# Patient Record
Sex: Male | Born: 1935 | Race: White | Hispanic: No | Smoking: Never smoker
Health system: Southern US, Community
[De-identification: ages and names within clinical notes are randomized; demographics above are authoritative.]

## PROBLEM LIST (undated history)

## (undated) DIAGNOSIS — K219 Gastro-esophageal reflux disease without esophagitis: Secondary | ICD-10-CM

## (undated) DIAGNOSIS — M1990 Unspecified osteoarthritis, unspecified site: Secondary | ICD-10-CM

## (undated) DIAGNOSIS — I495 Sick sinus syndrome: Secondary | ICD-10-CM

## (undated) DIAGNOSIS — I472 Ventricular tachycardia: Secondary | ICD-10-CM

## (undated) DIAGNOSIS — R519 Headache, unspecified: Secondary | ICD-10-CM

## (undated) DIAGNOSIS — I10 Essential (primary) hypertension: Secondary | ICD-10-CM

## (undated) DIAGNOSIS — I499 Cardiac arrhythmia, unspecified: Secondary | ICD-10-CM

## (undated) DIAGNOSIS — I4729 Other ventricular tachycardia: Secondary | ICD-10-CM

## (undated) DIAGNOSIS — E785 Hyperlipidemia, unspecified: Secondary | ICD-10-CM

## (undated) DIAGNOSIS — I2510 Atherosclerotic heart disease of native coronary artery without angina pectoris: Secondary | ICD-10-CM

## (undated) DIAGNOSIS — Z87442 Personal history of urinary calculi: Secondary | ICD-10-CM

## (undated) DIAGNOSIS — Z950 Presence of cardiac pacemaker: Secondary | ICD-10-CM

## (undated) DIAGNOSIS — F32A Depression, unspecified: Secondary | ICD-10-CM

## (undated) HISTORY — DX: Atherosclerotic heart disease of native coronary artery without angina pectoris: I25.10

## (undated) HISTORY — PX: OTHER SURGICAL HISTORY: SHX169

## (undated) HISTORY — PX: INSERT / REPLACE / REMOVE PACEMAKER: SUR710

## (undated) HISTORY — DX: Ventricular tachycardia: I47.2

## (undated) HISTORY — PX: PACEMAKER INSERTION: SHX728

## (undated) HISTORY — DX: Hyperlipidemia, unspecified: E78.5

## (undated) HISTORY — DX: Other ventricular tachycardia: I47.29

## (undated) HISTORY — DX: Sick sinus syndrome: I49.5

## (undated) HISTORY — DX: Presence of cardiac pacemaker: Z95.0

---

## 1999-01-05 HISTORY — PX: CORONARY ARTERY BYPASS GRAFT: SHX141

## 1999-02-05 DIAGNOSIS — I219 Acute myocardial infarction, unspecified: Secondary | ICD-10-CM

## 1999-02-05 HISTORY — DX: Acute myocardial infarction, unspecified: I21.9

## 1999-03-02 ENCOUNTER — Inpatient Hospital Stay (HOSPITAL_COMMUNITY): Admission: EM | Admit: 1999-03-02 | Discharge: 1999-03-14 | Payer: Self-pay | Admitting: Internal Medicine

## 1999-03-05 ENCOUNTER — Encounter: Payer: Self-pay | Admitting: Cardiothoracic Surgery

## 1999-03-06 ENCOUNTER — Encounter: Payer: Self-pay | Admitting: Cardiothoracic Surgery

## 1999-03-07 ENCOUNTER — Encounter: Payer: Self-pay | Admitting: Cardiothoracic Surgery

## 1999-03-08 ENCOUNTER — Encounter: Payer: Self-pay | Admitting: Cardiothoracic Surgery

## 1999-03-09 ENCOUNTER — Encounter: Payer: Self-pay | Admitting: Cardiothoracic Surgery

## 1999-03-11 ENCOUNTER — Encounter: Payer: Self-pay | Admitting: Cardiothoracic Surgery

## 2001-10-31 ENCOUNTER — Encounter: Admission: RE | Admit: 2001-10-31 | Discharge: 2001-10-31 | Payer: Self-pay | Admitting: Urology

## 2001-10-31 ENCOUNTER — Encounter: Payer: Self-pay | Admitting: Urology

## 2001-11-09 ENCOUNTER — Observation Stay (HOSPITAL_COMMUNITY): Admission: RE | Admit: 2001-11-09 | Discharge: 2001-11-10 | Payer: Self-pay | Admitting: Urology

## 2001-11-09 ENCOUNTER — Encounter (INDEPENDENT_AMBULATORY_CARE_PROVIDER_SITE_OTHER): Payer: Self-pay | Admitting: Specialist

## 2001-12-26 ENCOUNTER — Ambulatory Visit (HOSPITAL_BASED_OUTPATIENT_CLINIC_OR_DEPARTMENT_OTHER): Admission: RE | Admit: 2001-12-26 | Discharge: 2001-12-26 | Payer: Self-pay | Admitting: Urology

## 2002-10-29 ENCOUNTER — Encounter (INDEPENDENT_AMBULATORY_CARE_PROVIDER_SITE_OTHER): Payer: Self-pay

## 2002-10-29 ENCOUNTER — Ambulatory Visit (HOSPITAL_COMMUNITY): Admission: RE | Admit: 2002-10-29 | Discharge: 2002-10-29 | Payer: Self-pay | Admitting: Urology

## 2002-10-29 ENCOUNTER — Ambulatory Visit (HOSPITAL_BASED_OUTPATIENT_CLINIC_OR_DEPARTMENT_OTHER): Admission: RE | Admit: 2002-10-29 | Discharge: 2002-10-29 | Payer: Self-pay | Admitting: Urology

## 2002-10-29 ENCOUNTER — Encounter: Payer: Self-pay | Admitting: Urology

## 2003-01-05 HISTORY — PX: BLADDER STONE REMOVAL: SHX568

## 2003-12-25 ENCOUNTER — Ambulatory Visit: Payer: Self-pay | Admitting: *Deleted

## 2004-01-01 ENCOUNTER — Ambulatory Visit: Payer: Self-pay

## 2004-04-08 ENCOUNTER — Ambulatory Visit: Payer: Self-pay | Admitting: *Deleted

## 2004-10-20 ENCOUNTER — Ambulatory Visit: Payer: Self-pay | Admitting: *Deleted

## 2005-03-30 ENCOUNTER — Ambulatory Visit: Payer: Self-pay | Admitting: *Deleted

## 2005-04-19 ENCOUNTER — Ambulatory Visit: Payer: Self-pay | Admitting: *Deleted

## 2005-05-04 ENCOUNTER — Ambulatory Visit: Payer: Self-pay

## 2006-02-16 ENCOUNTER — Ambulatory Visit: Payer: Self-pay | Admitting: *Deleted

## 2006-02-16 LAB — CONVERTED CEMR LAB
AST: 27 units/L (ref 0–37)
Albumin: 3.6 g/dL (ref 3.5–5.2)
Alkaline Phosphatase: 69 units/L (ref 39–117)
BUN: 14 mg/dL (ref 6–23)
Chloride: 104 meq/L (ref 96–112)
Creatinine, Ser: 1.3 mg/dL (ref 0.4–1.5)
GFR calc non Af Amer: 57 mL/min
Total Bilirubin: 0.9 mg/dL (ref 0.3–1.2)
VLDL: 12 mg/dL (ref 0–40)

## 2006-02-22 ENCOUNTER — Ambulatory Visit: Payer: Self-pay

## 2006-02-22 ENCOUNTER — Encounter: Payer: Self-pay | Admitting: Cardiology

## 2006-08-10 ENCOUNTER — Ambulatory Visit: Payer: Self-pay | Admitting: Cardiology

## 2007-02-23 ENCOUNTER — Ambulatory Visit: Payer: Self-pay | Admitting: Cardiology

## 2008-09-04 HISTORY — PX: CARDIAC CATHETERIZATION: SHX172

## 2008-09-10 ENCOUNTER — Telehealth (INDEPENDENT_AMBULATORY_CARE_PROVIDER_SITE_OTHER): Payer: Self-pay | Admitting: *Deleted

## 2008-09-11 ENCOUNTER — Encounter: Payer: Self-pay | Admitting: Cardiovascular Disease

## 2008-09-11 ENCOUNTER — Ambulatory Visit: Payer: Self-pay | Admitting: Cardiovascular Disease

## 2008-09-11 ENCOUNTER — Inpatient Hospital Stay (HOSPITAL_COMMUNITY): Admission: AD | Admit: 2008-09-11 | Discharge: 2008-09-14 | Payer: Self-pay | Admitting: Cardiovascular Disease

## 2008-09-14 ENCOUNTER — Encounter: Payer: Self-pay | Admitting: Internal Medicine

## 2008-09-17 ENCOUNTER — Encounter: Payer: Self-pay | Admitting: Internal Medicine

## 2008-09-23 ENCOUNTER — Telehealth: Payer: Self-pay | Admitting: Cardiovascular Disease

## 2008-10-02 ENCOUNTER — Encounter: Payer: Self-pay | Admitting: Internal Medicine

## 2008-10-02 ENCOUNTER — Ambulatory Visit: Payer: Self-pay

## 2008-10-02 ENCOUNTER — Telehealth: Payer: Self-pay | Admitting: Cardiovascular Disease

## 2008-10-09 ENCOUNTER — Telehealth (INDEPENDENT_AMBULATORY_CARE_PROVIDER_SITE_OTHER): Payer: Self-pay | Admitting: *Deleted

## 2008-12-16 ENCOUNTER — Ambulatory Visit: Payer: Self-pay | Admitting: Internal Medicine

## 2009-02-26 ENCOUNTER — Telehealth (INDEPENDENT_AMBULATORY_CARE_PROVIDER_SITE_OTHER): Payer: Self-pay | Admitting: *Deleted

## 2009-03-17 ENCOUNTER — Ambulatory Visit: Payer: Self-pay | Admitting: Cardiovascular Disease

## 2009-04-21 ENCOUNTER — Telehealth: Payer: Self-pay | Admitting: Internal Medicine

## 2009-04-21 ENCOUNTER — Ambulatory Visit: Payer: Self-pay | Admitting: Cardiovascular Disease

## 2009-05-27 ENCOUNTER — Telehealth (INDEPENDENT_AMBULATORY_CARE_PROVIDER_SITE_OTHER): Payer: Self-pay | Admitting: *Deleted

## 2009-07-18 ENCOUNTER — Ambulatory Visit: Payer: Self-pay | Admitting: Internal Medicine

## 2009-08-18 ENCOUNTER — Telehealth (INDEPENDENT_AMBULATORY_CARE_PROVIDER_SITE_OTHER): Payer: Self-pay | Admitting: *Deleted

## 2009-08-20 ENCOUNTER — Ambulatory Visit: Payer: Self-pay | Admitting: Cardiovascular Disease

## 2009-08-21 ENCOUNTER — Telehealth (INDEPENDENT_AMBULATORY_CARE_PROVIDER_SITE_OTHER): Payer: Self-pay | Admitting: *Deleted

## 2009-08-25 ENCOUNTER — Encounter: Payer: Self-pay | Admitting: Internal Medicine

## 2009-08-25 ENCOUNTER — Ambulatory Visit: Payer: Self-pay | Admitting: Internal Medicine

## 2009-08-25 ENCOUNTER — Ambulatory Visit: Payer: Self-pay

## 2009-08-25 ENCOUNTER — Encounter (HOSPITAL_COMMUNITY): Admission: RE | Admit: 2009-08-25 | Discharge: 2009-09-23 | Payer: Self-pay | Admitting: Cardiovascular Disease

## 2009-08-28 ENCOUNTER — Ambulatory Visit: Payer: Self-pay | Admitting: Cardiovascular Disease

## 2009-10-16 ENCOUNTER — Ambulatory Visit: Payer: Self-pay | Admitting: Internal Medicine

## 2009-11-07 ENCOUNTER — Encounter (INDEPENDENT_AMBULATORY_CARE_PROVIDER_SITE_OTHER): Payer: Self-pay | Admitting: *Deleted

## 2009-11-18 ENCOUNTER — Ambulatory Visit: Payer: Self-pay | Admitting: Cardiovascular Disease

## 2009-12-02 ENCOUNTER — Telehealth (INDEPENDENT_AMBULATORY_CARE_PROVIDER_SITE_OTHER): Payer: Self-pay | Admitting: *Deleted

## 2010-01-15 ENCOUNTER — Encounter: Payer: Self-pay | Admitting: Internal Medicine

## 2010-01-15 ENCOUNTER — Ambulatory Visit
Admission: RE | Admit: 2010-01-15 | Discharge: 2010-01-15 | Payer: Self-pay | Source: Home / Self Care | Attending: Internal Medicine | Admitting: Internal Medicine

## 2010-02-03 NOTE — Assessment & Plan Note (Signed)
Summary: Cardiology Nuclear Testing  Nuclear Med Background Indications for Stress Test: Evaluation for Ischemia, Graft Patency  Indications Comments: Assess Cfx Area  History: CABG, Echo, GXT, Heart Catheterization, Myocardial Infarction, Myocardial Perfusion Study, Pacemaker  History Comments: 2001 AWMI, CABG x 5, 2007 GXT-Borderline ST Changes, 3 beat VT, 9/10 Heart Cath EF Nml 9/10 Pacemaker SSS- 60/120 9/10 Echo  EF Nml History of NSVT  Symptoms: Chest Pain, Chest Pain with Exertion, Dizziness, DOE, Fatigue with Exertion, Light-Headedness    Nuclear Pre-Procedure Caffeine/Decaff Intake: None NPO After: 9:30 PM Lungs: clear IV 0.9% NS with Angio Cath: 22g     IV Site: (R) Hand IV Started by: Irean Hong RN Chest Size (in) 38     Height (in): 68 Weight (lb): 150 BMI: 22.89 Tech Comments: Held metoprolol this am.  Nuclear Med Study 1 or 2 day study:  1 day     Stress Test Type:  Eugenie Birks Reading MD:  Arvilla Meres, MD     Referring MD:  Judie Petit.Arida Resting Radionuclide:  Technetium 53m Tetrofosmin     Resting Radionuclide Dose:  10.3 mCi  Stress Radionuclide:  Technetium 105m Tetrofosmin     Stress Radionuclide Dose:  33 mCi   Stress Protocol   Lexiscan: 0.4 mg   Stress Test Technologist:  Milana Na EMT-P     Nuclear Technologist:  Domenic Polite CNMT  Rest Procedure  Myocardial perfusion imaging was performed at rest 45 minutes following the intravenous administration of Myoview Technetium 65m Tetrofosmin.  Stress Procedure  The patient received IV Lexiscan 0.4 mg over 15-seconds.  Myoview injected at 30-seconds.  There were no significant changes and occ pacs with infusion.  Quantitative spect images were obtained after a 45 minute delay.  QPS Raw Data Images:  Normal; no motion artifact; normal heart/lung ratio. Stress Images:  There is normal uptake in all areas. Rest Images:  Normal homogeneous uptake in all areas of the myocardium. Subtraction  (SDS):  Normal Transient Ischemic Dilatation:  .89  (Normal <1.22)  Lung/Heart Ratio:  .23  (Normal <0.45)  Quantitative Gated Spect Images QGS EDV:  69 ml QGS ESV:  19 ml QGS EF:  72 % QGS cine images:  Normal  Findings Normal nuclear study      Overall Impression  Exercise Capacity: Lexiscan study with no exercise. ECG Impression: Baseline: NSR; No significant ST segment change with Lexiscan. Overall Impression: Normal stress nuclear study.

## 2010-02-03 NOTE — Progress Notes (Signed)
Summary: Nuc Pre-Procedure  Phone Note Outgoing Call Call back at [facility] Phone [phone]   Call placed by: Antionette Char RN,  August 21, 2009 3:00 PM Call placed to: Patient Summary of Call: Reviewed information on Myoview Information Sheet (see scanned document for further details).  Spoke with patient.     Nuclear Med Background Indications for Stress Test: Evaluation for Ischemia, Graft Patency  Indications Comments: Assess Cfx Area  History: CABG, Echo, GXT, Heart Catheterization, Myocardial Infarction, Myocardial Perfusion Study, Pacemaker  History Comments: 2001 AWMI, CABG x 5, 2007 GXT-Borderline ST Changes, 3 beat VT, 9/10 Heart Cath EF Nml 9/10 Pacemaker SSS- 60/120 9/10 Echo  EF Nml History of NSVT  Symptoms: Chest Pain, Chest Pain with Exertion, Dizziness, DOE, Fatigue with Exertion, Light-Headedness

## 2010-02-03 NOTE — Progress Notes (Signed)
Summary: chestpain   Phone Note Call from Patient Call back at Home Phone [phone]   Caller: Patient Reason for Call: Talk to Nurse Summary of Call: pt wants to be seen today. c/o chestpain. took nitro x2  on friday evening. Initial call taken by: Lorne Skeens,  April 21, 2009 8:55 AM  Follow-up for Phone Call        04/20/09--9am--pt calling stating he had chest pain on 04/17/09 --none since--pt advised to GO TO NEAREST ED--pt states he goes to our office in [place] and would rather go there--advised to call our [place] office and be seen by dr allen--pt Suzan Nailer office notified Follow-up by: Ledon Snare, RN,  April 21, 2009 9:03 AM

## 2010-02-03 NOTE — Progress Notes (Signed)
  Request for Records recieved from [facility] sent to [facility]  May 27, 2009 8:46 AM

## 2010-02-03 NOTE — Cardiovascular Report (Signed)
Summary: Office Visit Remote   Office Visit Remote   Imported By: Roderic Ovens 11/11/2009 15:29:22  _____________________________________________________________________  External Attachment:    Type:   Image     Comment:   External Document

## 2010-02-03 NOTE — Progress Notes (Signed)
Summary: PT DOES NOT FEEL RIGHT  Phone Note Call from Patient   Summary of Call: PT CALLED [place] OFFICE AND WAS TRANSFERRED TO [place] OFFICE.  COMPLAINED THAT HE FEELS LIKE HIS DEVICE HAS MOVED.  HAS LEFT ARM PAIN WITH MOVEMENT AND HAS HAD NAUSEA/DIZZINESS X 2 WEEKS.  "JUST DOESN'T FEEL RIGHT"  DR. KLEIN IN [place] 8-26 BUT NOT SURE IF HE SHOULD WAIT THAT LONG AND IF SO WHERE TO PUT ON SCHEDULE. Initial call taken by: Park Breed,  August 18, 2009 4:04 PM  Follow-up for Phone Call        Spoke with patient, states he is having random chest discomfort with nausea and dizziness and also feels as though his device has moved down in his chest.  He is being set up to see Dr. Kirke Corin in [place] for the evaluation of his chest pain and Dr. Graciela Husbands for device evaluation.   Follow-up by: Altha Harm, LPN,  August 20, 2009 2:24 PM

## 2010-02-03 NOTE — Letter (Signed)
Summary: Remote Device Check  [facility], Main Office  [address]   [place], [place] [address]   Phone: [phone]  Fax: [phone]     November 07, 2009 MRN: [id number]   [facility] [address] [place], [place]  [address]   Dear Mr. Scheer,   Your remote transmission was recieved and reviewed by your physician.  All diagnostics were within normal limits for you.  ___X__Your next transmission is scheduled for1/12/2010.:  Please transmit at any time this day.  If you have a wireless device your transmission will be sent automatically.  ______Your next office visit is scheduled for:                              . Please call our office to schedule an appointment.    Sincerely,  Altha Harm, LPN

## 2010-02-03 NOTE — Progress Notes (Signed)
  Phone Note Call from Patient   Summary of Call: Per patient phone call, since he [place] at the beach he has had left sided chest soreness, and generally not feeling well and wanted to know if it had to do with his device. According to the patient he was bending with his knees pressing into his chest around the area of his device but the soreness involves the entire left side of his chest.  I explained that that small amount of time and pressure was not harmful to the device but the chest discomfort along with not feeling well after washing the car and lasting into today may need to be evaluated.  He wanted to be seen in the [place] office the next time Dr. Graciela Husbands is there(03/10/09).  I also tried to explain if he is having discomfort and not feeling well he needs to be evaluated sooner and suggested an ER visit there. [name] was not agreeable to that and said he was driving back tomorrow and if he wasn't any better he'd call the [place] office.  I cautioned him on driving that distance not feeling well and his response was to let his wife drive.  I also cautioned that she couldn't watch the road and him at the same time.  We ended the conversation with the patient wanted to think before he proceeded with his decision on the options I outlined for him. Initial call taken by: Altha Harm, LPN,  February 26, 2009 1:30 PM

## 2010-02-03 NOTE — Procedures (Signed)
Summary: Cardiology Device Clinic   Allergies: No Known Drug Allergies  PPM Specifications Following MD:  Sherryl Manges, MD     PPM Vendor:  [employer]     PPM Model Number:  [id number]     PPM Serial Number:  [id number] PPM DOI:  09/13/2008     PPM Implanting MD:  Sherryl Manges, MD  Lead 1    Location: RA     DOI: 09/13/2008     Model #: [id number]     Serial #: [id number]     Status: active Lead 2    Location: RV     DOI: 09/13/2008     Model #: [id number]     Serial #: [id number]     Status: active  Magnet Response Rate:  BOL 85 ERI  65  Indications:  Sick sinus syndrome   PPM Follow Up Remote Check?  No Battery Voltage:  2.80 V     Battery Est. Longevity:  13 years     Pacer Dependent:  Yes       PPM Device Measurements Atrium  Impedance: 511 ohms, Threshold: 0.875 V at 0.4 msec Right Ventricle  Amplitude: 16 mV, Impedance: 1079 ohms, Threshold: 0.375 V at 0.4 msec  Episodes MS Episodes:  0     Percent Mode Switch:  0     Coumadin:  No Ventricular High Rate:  1     Atrial Pacing:  85.8%     Ventricular Pacing:  0.2%  Parameters Mode:  DDDR+     Lower Rate Limit:  60     Upper Rate Limit:  120 Paced AV Delay:  150     Sensed AV Delay:  120 Next Remote Date:  10/16/2009     Next Cardiology Appt Due:  07/05/2010 Tech Comments:  No parameter changes.  Device function normal. 1 VHR episode 4 seconds.  [facility] transmissions every 3 months. ROV 1 year with Dr. Graciela Husbands in [place]. Altha Harm, LPN  July 18, 2009 9:15 AM

## 2010-02-03 NOTE — Progress Notes (Signed)
Summary: pt has questions  Phone Note Call from Patient Call back at Home Phone [phone]   Caller: Patient/Nancy Reason for Call: Talk to Nurse, Talk to Doctor Summary of Call: pt wants to do accupuncture and has questions regarding how it will affect his pacer Initial call taken by: Omer Jack,  December 02, 2009 3:39 PM  Follow-up for Phone Call        Patient referred to tech support for [employer] Follow-up by: Altha Harm, LPN,  December 02, 2009 4:52 PM

## 2010-02-15 ENCOUNTER — Encounter (INDEPENDENT_AMBULATORY_CARE_PROVIDER_SITE_OTHER): Payer: Self-pay | Admitting: *Deleted

## 2010-02-16 ENCOUNTER — Telehealth: Payer: Self-pay | Admitting: Cardiovascular Disease

## 2010-02-25 NOTE — Cardiovascular Report (Signed)
Summary: Office Visit Remote   Office Visit Remote   Imported By: Roderic Ovens 02/17/2010 15:19:22  _____________________________________________________________________  External Attachment:    Type:   Image     Comment:   External Document

## 2010-02-25 NOTE — Progress Notes (Signed)
Summary: headaches  Phone Note Call from Patient   Caller: Spouse Summary of Call: Had chemical stress test in August-wondered if it could be the cause of Headaches. Has seen Dr. Adella Hare in [place] x 2 with no release from headaches.  Would like Dr. Kirke Corin to call wife.[phone] Initial call taken by: Dessie Coma  LPN,  February 16, 2010 12:00 PM    I called Mrs. Mathews Robinsons. It is not related. He will continue follow up with Neurology.

## 2010-02-25 NOTE — Letter (Signed)
Summary: Remote Device Check  [facility], Main Office  [address]   [place], [place] [address]   Phone: [phone]  Fax: [phone]     February 15, 2010 MRN: [id number]   [facility] [address] [place], [place]  [address]   Dear [name],   Your remote transmission was recieved and reviewed by your physician.  All diagnostics were within normal limits for you.  __X___Your next transmission is scheduled for:  04-09-2010.  Please transmit at any time this day.  If you have a wireless device your transmission will be sent automatically.   Sincerely,  Vella Kohler

## 2010-04-10 LAB — BASIC METABOLIC PANEL
CO2: 28 mEq/L (ref 19–32)
Calcium: 8.5 mg/dL (ref 8.4–10.5)
Creatinine, Ser: 1.05 mg/dL (ref 0.4–1.5)
GFR calc Af Amer: >60 mL/min (ref >60)
GFR calc non Af Amer: >60 mL/min (ref >60)
Glucose, Bld: 90 mg/dL (ref 70–99)
Sodium: 137 mEq/L (ref 135–145)

## 2010-04-10 LAB — CBC
Hemoglobin: 14 g/dL (ref 13.0–17.0)
MCHC: 33.3 g/dL (ref 30.0–36.0)
MCHC: 33.5 g/dL (ref 30.0–36.0)
MCV: 96.2 fL (ref 78.0–100.0)
MCV: 96.8 fL (ref 78.0–100.0)
RBC: 4.03 MIL/uL — ABNORMAL LOW (ref 4.22–5.81)
RBC: 4.34 MIL/uL (ref 4.22–5.81)
RDW: 14 % (ref 11.5–15.5)
RDW: 14.3 % (ref 11.5–15.5)

## 2010-04-10 LAB — DIFFERENTIAL
Basophils Absolute: 0 10*3/uL (ref 0.0–0.1)
Basophils Relative: 0 % (ref 0–1)
Eosinophils Absolute: 0.2 10*3/uL (ref 0.0–0.7)
Eosinophils Relative: 4 % (ref 0–5)
Monocytes Absolute: 0.5 10*3/uL (ref 0.1–1.0)
Monocytes Relative: 9 % (ref 3–12)
Neutro Abs: 2.8 10*3/uL (ref 1.7–7.7)

## 2010-04-10 LAB — COMPREHENSIVE METABOLIC PANEL
ALT: 12 U/L (ref 0–53)
AST: 24 U/L (ref 0–37)
Albumin: 3.8 g/dL (ref 3.5–5.2)
CO2: 31 mEq/L (ref 19–32)
Calcium: 9.2 mg/dL (ref 8.4–10.5)
Chloride: 103 mEq/L (ref 96–112)
GFR calc Af Amer: >60 mL/min (ref >60)
GFR calc non Af Amer: 57 mL/min — ABNORMAL LOW (ref >60)
Sodium: 141 mEq/L (ref 135–145)

## 2010-04-10 LAB — GLUCOSE, CAPILLARY
Glucose-Capillary: 89 mg/dL (ref 70–99)
Glucose-Capillary: 94 mg/dL (ref 70–99)

## 2010-04-10 LAB — CARDIAC PANEL(CRET KIN+CKTOT+MB+TROPI)
CK, MB: 1.3 ng/mL (ref 0.3–4.0)
CK, MB: 1.5 ng/mL (ref 0.3–4.0)
CK, MB: 2.1 ng/mL (ref 0.3–4.0)
Relative Index: INVALID (ref 0.0–2.5)
Total CK: 45 U/L (ref 7–232)
Troponin I: 0.02 ng/mL (ref 0.00–0.06)

## 2010-04-10 LAB — LIPID PANEL
HDL: 43 mg/dL (ref >39)
Total CHOL/HDL Ratio: 3.1 RATIO
Triglycerides: 75 mg/dL (ref <150)

## 2010-04-16 ENCOUNTER — Other Ambulatory Visit: Payer: Self-pay

## 2010-04-16 ENCOUNTER — Ambulatory Visit (INDEPENDENT_AMBULATORY_CARE_PROVIDER_SITE_OTHER): Payer: Medicare Other | Admitting: *Deleted

## 2010-04-16 DIAGNOSIS — I495 Sick sinus syndrome: Secondary | ICD-10-CM

## 2010-04-23 NOTE — Progress Notes (Signed)
Pacer remote 

## 2010-05-05 ENCOUNTER — Encounter: Payer: Self-pay | Admitting: *Deleted

## 2010-05-11 ENCOUNTER — Encounter: Payer: Self-pay | Admitting: Cardiovascular Disease

## 2010-05-14 ENCOUNTER — Ambulatory Visit (INDEPENDENT_AMBULATORY_CARE_PROVIDER_SITE_OTHER): Payer: Medicare Other | Admitting: Cardiovascular Disease

## 2010-05-14 ENCOUNTER — Encounter: Payer: Self-pay | Admitting: Cardiovascular Disease

## 2010-05-14 DIAGNOSIS — E785 Hyperlipidemia, unspecified: Secondary | ICD-10-CM | POA: Insufficient documentation

## 2010-05-14 DIAGNOSIS — I2510 Atherosclerotic heart disease of native coronary artery without angina pectoris: Secondary | ICD-10-CM | POA: Insufficient documentation

## 2010-05-14 NOTE — Assessment & Plan Note (Signed)
His most recent lipid profile was within acceptable range. Will continue treatment with simvastatin.

## 2010-05-14 NOTE — Progress Notes (Signed)
HPI  [name] is an 77 year old gentleman who is here today for a six-month followup visit. Overall he has been stable from a cardiac standpoint with no reported symptoms of chest pain, dyspnea, palpitations or dizziness. He continues to have almost daily headaches and is now being treated by Dr. Adella Hare in neurology. He was started on nortriptyline with some improvement in his symptoms. He has been taking all his cardiac medications with no issues.  No Known Allergies   Current Outpatient Prescriptions on File Prior to Visit  Medication Sig Dispense Refill  . aspirin 81 MG tablet Take 81 mg by mouth daily.        . metoprolol tartrate (LOPRESSOR) 25 MG tablet Take 25 mg by mouth 2 (two) times daily.       . Multiple Vitamin (MULTIVITAMIN) tablet Take 1 tablet by mouth daily.        . nitroGLYCERIN (NITROSTAT) 0.4 MG SL tablet Place 0.4 mg under the tongue every 5 (five) minutes as needed.        Marland Kitchen omeprazole (PRILOSEC) 20 MG capsule Take 40 mg by mouth daily.        . simvastatin (ZOCOR) 20 MG tablet Take 20 mg by mouth at bedtime.        Marland Kitchen DISCONTD: sertraline (ZOLOFT) 100 MG tablet Take 100 mg by mouth daily.        Marland Kitchen DISCONTD: traZODone (DESYREL) 50 MG tablet Take 50 mg by mouth at bedtime.           Past Medical History  Diagnosis Date  . Sinus node dysfunction     s/p PPM  . Ventricular tachycardia, non-sustained   . CAD (coronary artery disease)     s/p CABG in 2001  . HLD (hyperlipidemia)      Past Surgical History  Procedure Date  . Bladder stone removal 2005  . Pacemaker insertion   . Coronary artery bypass graft 2001    LIMA to LAD, SVG to RCA, SVG to ramus, sequential SVG to  first and second diagonal.  . Cardiac catheterization 09/2008    patent grafts. 80% proximal stneosis in left circumflex artery (subsequent stress test showed no ischemia)     Family History  Problem Relation Age of Onset  . Heart attack       History   Social History  . Marital  Status: Married    Spouse Name: N/A    Number of Children: 1  . Years of Education: N/A   Occupational History  . retired    Social History Main Topics  . Smoking status: Never Smoker   . Smokeless tobacco: Not on file  . Alcohol Use: No  . Drug Use: No  . Sexually Active: Not on file   Other Topics Concern  . Not on file   Social History Narrative  . No narrative on file      PHYSICAL EXAM   BP 129/83  Pulse 70  Ht 5\' 7"  (1.702 m)  Wt 155 lb (70.308 kg)  BMI 24.28 kg/m2  SpO2 93%  Constitutional: He is oriented to person, place, and time. He appears well-developed and well-nourished. No distress.  HENT: No nasal discharge.  Head: Normocephalic and atraumatic.  Eyes: Pupils are equal, round, and reactive to light. Right eye exhibits no discharge. Left eye exhibits no discharge.  Neck: Normal range of motion. Neck supple. No JVD present. No thyromegaly present.  Cardiovascular: Normal rate, regular rhythm, normal heart sounds and intact distal  pulses. Exam reveals no gallop and no friction rub.  No murmur heard.  Pulmonary/Chest: Effort normal and breath sounds normal. No stridor. No respiratory distress. He has no wheezes. He has no rales. He exhibits no tenderness.  Abdominal: Soft. Bowel sounds are normal. He exhibits no distension. There is no tenderness. There is no rebound and no guarding.  Musculoskeletal: Normal range of motion. He exhibits no edema and no tenderness.  Neurological: He is alert and oriented to person, place, and time. Coordination normal.  Skin: Skin is warm and dry. No rash noted. He is not diaphoretic. No erythema. No pallor.  Psychiatric: He has a normal mood and affect. His behavior is normal. Judgment and thought content normal.        ASSESSMENT AND PLAN

## 2010-05-14 NOTE — Patient Instructions (Signed)
Your physician recommends that you schedule a follow-up appointment in: 9 months  

## 2010-05-14 NOTE — Assessment & Plan Note (Signed)
The patient is doing well at this time from a cardiac standpoint with no symptoms suggestive of angina, heart failure or arrhythmia. Would continue with medical therapy. He would continue to take aspirin 81 mg daily and metoprolol 25 mg twice daily. Would continue management of his hyperlipidemia.

## 2010-05-19 NOTE — Assessment & Plan Note (Signed)
[facility]                        [place] CARDIOLOGY OFFICE NOTE   NAME:[name], [name]                        MRN:          [id number]  DATE:12/16/2008                            DOB:          08/29/1927    [name] seen in followup for pacemaker implanted for symptomatic  bradycardia.  He also has ischemic symptomatic bradycardia.  He is  feeling much better.  There has been no lightheadedness and less  shortness of breath.   He also has a history of ischemic heart disease and underwent  catheterization at [facility] that demonstrated a totally occluded circ  following the obtuse marginal and it was elected to defer PCI until  after the pacemaker was implanted and healed.   The patient was elected following discussions with somebody to not  pursue that as he was relatively asymptomatic.  I should note that he  also has a history of bypass surgery.  He currently denies problems with  exertional chest discomfort, shortness of breath, or chest pain.   Review of his echocardiogram demonstrated normal left ventricular  function.  EF was confirmed as normal by ventriculography.   His current medications include:  1. Simvastatin 20.  2. Omeprazole 20.  3. Sertraline.  4. Amlodipine 2.5.  5. Metoprolol 25 b.i.d.  6. Trazodone.  7. Aspirin.   On examination today, his blood pressure was 127/77, his pulse was 70,  his weight was 152, which is up 5 pounds.  His neck veins were flat.  His lungs were clear.  Back was without kyphosis and there was no CVA  tenderness.  Heart sounds were regular with a high-pitched 2/6 systolic  murmur heard at the left lower sternal border.  The abdomen was soft.  Extremities were without edema.  His affect was relatively bright (see  below).   Interrogation of his pacemaker demonstrated that he is 88% atrial paced,  pacing parameters were all otherwise normal with an atrial impedance of  527, ventricle impedance of 1261.   There was no intrinsic atrial rhythm.  The R-wave was 16.0.   IMPRESSION:  1. Sinus node dysfunction.  2. Status post pacer for the above.  3. Ischemic heart disease.      a.     Status post coronary artery bypass graft.      b.     Normal left ventricular function.      c.     Occluded circumflex with PCI leak, initially decide upon and       then deferred.  4. Dyslipidemia.   [name] is doing well.  We will plan to see him again in 9 months.  I  should note that I reviewed with the family his liver tests and his  lipids, which demonstrated an LDL of 77 and normal transaminases.  He  has not been following up with a primary care physician.  We will plan  to him come back and see [name] in 3 months to reestablish cardiac  followup.  He will need his lipids and liver function test reassessed at  that time.  Duke Salvia, MD, [facility]  Electronically Signed    SCK/MedQ  DD: 12/16/2008  DT: 12/17/2008  Job #: [id number]

## 2010-05-19 NOTE — Assessment & Plan Note (Signed)
[place] [facility]                        [place] CARDIOLOGY OFFICE NOTE   NAME:[name], [name]                        MRN:          [id number]  DATE:09/11/2008                            DOB:          27-Feb-1927    CHIEF COMPLAINT:  Dizziness and chest pain.   HISTORY OF PRESENT ILLNESS:  [name] is a 77 year old white male with  past medical history significant for coronary artery disease status post  coronary artery bypass surgery in 2001 by Dr. Tyrone Sage, hyperlipidemia,  history of asymptomatic sinus bradycardia in the high 40s and low 50s  who is presenting today with a several week to month history of  worsening dizziness, fatigue, and chest discomfort.  The patient states  that for the past several weeks he has been consistently dizzy.  He  denies any frank syncopal episodes.  The patient also states that his  energy level has been severely decreased.  The patient also endorses a  slight increase in chest discomfort with exertion that lasts for hours  and sometimes a whole day, associated with increased fatigue, before  remitting after a prolonged rest.  Yesterday while restoring an old  automobile, patient had acute onset of substernal chest discomfort  without radiation associated with fatigue that has persisted through  today (pt quantifies it as a 2/10).  The patient states that this is not  uncommon to persist this long.  However, the intensity is somewhat worse  than usual.  Of note, the patient has also had significant psychosocial  stressors in the past year as his daughter has been diagnosed with  esophageal cancer which unfortunately has been deemed terminal.   PAST MEDICAL HISTORY:  CAD s/p CABG in 2001 with the LIMA to the LAD,  sequential SVG to D1 and D2, SVG to the OM1, SVG to the distal RCA  Hyperlipidemia  h/o asymptomatic sinus bradycardia (40's and 50's)   SOCIAL HISTORY:  No tobacco, no alcohol.   FAMILY HISTORY:  Significant  for premature coronary disease.   ALLERGIES:  No known drug allergies.   MEDICATIONS:  1. Aspirin 81 mg nightly.  2. Zoloft 50 mg daily.  3. Multivitamin daily.  4. Omeprazole 20 mg daily.   REVIEW OF SYSTEMS:  The patient denies any syncope.  Endorses trace  lower extremity edema.  Denies any PND or orthopnea.  The patient also  denies any hematochezia, melena or other bleeding issues.  Other systems  as in HPI; otherwise, negative.   PHYSICAL EXAMINATION:  Patient has a blood pressure of 146/69, his heart  rate is 40, rechecked by me is 38.  He weighs 147 pounds, and he is  saturating 96% on room air.  GENERAL:  He is in no acute distress.  HEENT:  Normocephalic, atraumatic.  Cranial nerves II-XII are grossly  intact.  NECK:  Supple.  There is no JVD.  There are no carotid bruits.  Carotid  upstrokes normal.  HEART:  Regular but bradycardic without murmur, rub or gallop.  LUNGS:  Have some bibasilar crackles.  ABDOMEN:  Nontender, nondistended, soft.  EXTREMITIES:  Without  edema.  SKIN:  Warm and dry.  NEUROLOGICAL:  Nonfocal.  PSYCHIATRIC:  Patient is appropriate with normal levels of insight.  MUSCULOSKELETAL:  Patient is 5/5 bilateral upper and lower extremity  strength.  Pulses:  Patient is 2+ bilateral carotid and radial pulses  with bilateral palpable posterior tibial pulses.  EKG from today in the office independently reviewed by myself  demonstrates normal sinus bradycardia with a rate of 37 beats per  minute.   ASSESSMENT:  77yo white male with known CAD, now approximately 9 years  after his coronary artery bypass grafting who has developed progressive  and now symptomatic sinus bradycardia.  The patient is also having chest  discomfort with exertion which has slightly worsened over the past year.  This chest discomfort may be secondary to his sinus bradycardia or it  may be a result of progressive atherosclerosis of his coronary artery  bypass graft(s).    PLAN:  After discussion with the patient, we will admit him directly to  [facility].  He was given ASA 162mg  in clinic.  Upon admission  he will be ruled out for acute myocardial infarction, and we will check  a transthoracic echocardiogram to evaluate the left ventricular systolic  function.  He should likely undergo ischemia testing in the form of a  left heart catheterization (assuming a normal renal function) or  possibly a stress test.  He subsequently should undergo permanent  pacemaker implantation for symptomatic sinus bradycardia.  We will place  him on ASA, statin, NTG.  If hemoglobin is within normal limits,  anticoagulation with unfractionated heparin or lovenox will be  considered.  This case was discussed with Dr. Graciela Husbands and Dr. Eden Emms who  will be accepting the patient.     Brayton El, MD  Electronically Signed    SGA/MedQ  DD: 09/11/2008  DT: 09/11/2008  Job #: [id number]

## 2010-05-19 NOTE — Assessment & Plan Note (Signed)
[facility]                        [place] CARDIOLOGY OFFICE NOTE   NAME:[name], [name]                        MRN:          [id number]  DATE:11/18/2009                            DOB:          March 11, 1927    [name] is an 77 year old gentleman who is here today for a followup  visit.  He has the following problem list:  1. Coronary artery disease status post coronary artery bypass graft      surgery in 2001.  Most recent cardiac catheterization was done in      September 2010 which showed patent LIMA to LAD, patent SVG to      distal RCA, patent SVG to ramus, and patent sequential SVG to first      and second diagonals.  Circumflex distribution was not bypassed.      Native circumflex had an 80% proximal stenosis.  Ejection fraction      was normal.  Subsequent stress test showed no evidence of ischemia.  2. Sinus node dysfunction status post permanent pacemaker placement.  3. Nonsustained ventricular tachycardia.  4. Hyperlipidemia.   INTERVAL HISTORY:  [name] is here today for a followup visit.  Overall, he has been doing reasonably well.  During his last visit, I  added Imdur 30 mg once daily.  However, he developed headache and  stopped taking the medication.  Since then he continued to have headache  and has been evaluated by CT scan and some labs.  His headache is still  happening regularly.  He has not had any recent chest pain.  His dyspnea  is stable.  His home blood pressure and heart rate appeared to be in the  optimal range.   MEDICATIONS:  1. Multivitamin once daily.  2. Omeprazole 20 mg two tablets once daily.  3. Simvastatin 20 mg once daily.  4. Trazodone 50 mg once daily.  5. Aspirin 81 mg once daily.  6. Sertraline 100 mg once daily.  7. Metoprolol tartrate 25 mg half a tablet twice daily.   PHYSICAL EXAMINATION:  VITAL SIGNS:  Weight is 151.6, blood pressure is  124/73, pulse is 63, oxygen saturation is 96% on room  air.  NECK:  Reveals no JVD or carotid bruits.  LUNGS:  Clear to auscultation.  HEART:  Regular rate and rhythm with premature beats.  There are no  gallops or murmurs.  ABDOMEN:  Benign, nontender, nondistended.  EXTREMITIES:  With no clubbing, cyanosis, or edema.   IMPRESSION:  1. Coronary artery disease status post coronary artery bypass graft      surgery:  The patient is not having any symptoms suggestive of      angina at this time.  He has mild palpitations.  His dyspnea is      stable.  We will continue with medical therapy for now.  2. Hypertension:  His blood pressure is well-controlled with      metoprolol alone which will be continued.  3. Hyperlipidemia:  His most recent lipid profile in March of this      year was optimal.  We will continue  with simvastatin.  He will      follow up.  Regarding his headache, I advised him to continue      following up with his primary care physician.  A Neurology      consultation might be appropriate if he continues to have this      problem.  He will follow up in 6 months from now or earlier if      needed.     Lorine Bears, MD  Electronically Signed    MA/MedQ  DD: 11/18/2009  DT: 11/18/2009  Job #: [id number]

## 2010-05-19 NOTE — Assessment & Plan Note (Signed)
[facility]                        [place] CARDIOLOGY OFFICE NOTE   NAME:[name], [name]                        MRN:          [id number]  DATE:08/20/2009                            DOB:          22-Sep-1927    PROBLEM LIST:  1. Coronary artery disease status post coronary artery bypass graft      surgery in 2001.  Most recent cardiac catheterization was done in      September 2010, which showed severe native 3-vessel coronary artery      disease, patent LIMA to LAD, patent SVG to distal RCA, patent SVG      to ramus and patent sequential SVG to first and second diagonals.      The circumflex distribution was not bypassed.  The native      circumflex had an 80% proximal stenosis that was not treated at      that time.  Ejection fraction was normal.  2. Sinus node dysfunction status post permanent pacemaker placement.  3. Hyperlipidemia.   INTERIM HISTORY:  The patient is here today for a followup visit.  Overall, he has been doing reasonably well.  However, he has been having  increased symptoms of chest pain, which he describes as aching in the  chest which can happen any time with rest and with activities.  He is  overall a poor historian about it and cannot characterize it very well.  He also mentions that he is getting dyspnea with minimal activities,  which has been progressive.  He has been taking all his medications.  He  is complaining of dizziness and generalized fatigue and thinks it might  be due to his relatively low blood pressure.   MEDICATIONS:  1. Multivitamin once daily.  2. Omeprazole 20 mg once daily.  3. Simvastatin 20 mg daily.  4. Sertraline 50 mg once daily.  5. Amlodipine 2.5 mg once daily.  6. Metoprolol tartrate 25 mg twice daily.  7. Trazodone 50 mg once daily.  8. Aspirin 81 mg once daily.   ALLERGIES:  No known drug allergies.   PHYSICAL EXAMINATION:  GENERAL:  The patient is pleasant and in no acute  distress.  VITAL SIGNS:  Weight is 152.6 pounds, blood pressure is 107/65, pulse is  60 and regular, oxygen saturation is 93% on room air.  HEENT:  Normocephalic, atraumatic.  NECK:  No JVD or carotid bruits.  RESPIRATORY:  Normal respiratory effort with no use of accessory  muscles.  Auscultation reveals few crackles bilaterally.  CARDIOVASCULAR:  Normal PMI.  Regular rate and rhythm with no gallops or  murmurs.  ABDOMEN:  Benign, nontender, nondistended.  EXTREMITIES:  No clubbing, cyanosis, or edema.  PSYCHIATRIC:  He is alert, oriented x3 with normal mood and affect.  SKIN:  No rash.  MUSCULOSKELETAL:  Normal muscle strength in upper and lower extremity.   IMPRESSION:  1. Coronary artery disease status post coronary artery bypass surgery.      Currently, he is complaining of atypical chest pain and increased      exertional dyspnea.  His most recent cardiac catheterization in  September 2010 showed significant stenosis in proximal circumflex,      which was not bypassed.  I think before proceeding with a cardiac      catheterization, it is important to document if he has ischemia in      that area, which might be responsible for his symptoms.  Thus, I      recommend proceeding with a Lexiscan nuclear stress test.      According to the patient, he is not able to exercise on the      treadmill due to poor exercise capacity.  2. Dizziness which might be due to his relatively low blood pressure.      According to him, he never had hypertension before.  We will      discontinue amlodipine for now and ask him to bring his blood      pressure readings with him during the next visit.  3. History of nonsustained ventricular tachycardia.  We will continue      with the metoprolol.  4. Hyperlipidemia:  His lipid profile was reviewed and it is at      target.  The patient will follow up after stress test.     Lorine Bears, MD  Electronically Signed    MA/MedQ  DD: 08/20/2009  DT:  08/21/2009  Job #: [id number]

## 2010-05-19 NOTE — Assessment & Plan Note (Signed)
[facility]                        [place] CARDIOLOGY OFFICE NOTE   NAME:[name], [name] ODDO                        MRN:          [id number]  DATE:04/21/2009                            DOB:          04-25-1927    PROBLEM LIST:  1. Coronary artery disease status post coronary artery bypass graft in      2001, with a left internal mammary artery graft to left anterior      descending coronary artery, saphenous vein graft to D1 and D2,      saphenous vein graft to obtuse marginal 1, and saphenous vein graft      to distal right coronary artery, last left heart catheterization in      September 2010, showing patent grafts and a severe stenosis in the      apical portion of the left anterior descending coronary artery.  2. Hyperlipidemia.  3. History of symptomatic sinus bradycardia, status post pacemaker in      September 2010.   INTERVAL HISTORY:  The patient comes into clinic today, complaining of 3-  day history of abdominal discomfort.  The patient states that after  playing golf on Friday evening, he had onset of epigastric discomfort.  This discomfort has been present without remission for the past 3 days  and it is sometimes worsened by twisting his thorax or by pressing on  his abdomen.  He denies any hematochezia, melena, nausea, or vomiting.  He also denies any dyspnea.  He has been taking Advil several times a  day for the last few days for headache.  The patient states he has also  taken nitroglycerin which seemed to may be partially help the  discomfort, but not consistently so.   PHYSICAL EXAMINATION:  VITAL SIGNS:  His blood pressure is 104/63, his  pulse is 60.  He weighs 155 pounds which is stable from March and he is  saturating 95% on room air.  GENERAL:  No acute distress.  HEENT:  Normocephalic, atraumatic.  HEART:  Regular rate and rhythm without murmur, rub, or gallop.  LUNGS:  Clear bilaterally.  ABDOMEN:  Epigastric tenderness to  deep palpation.  No rebound.  No  guarding.  EXTREMITIES:  Without edema.  SKIN:  Warm and dry.  PSYCHIATRIC:  The patient is appropriate.   Review of EKG taken today in clinic independently interpreted by myself  demonstrates an A-paced rhythm at 65 beats per minute.  No significant  ST-segment or T-wave abnormalities.   ASSESSMENT:  A 77 year old male with coronary artery disease, presenting  with epigastric discomfort that is likely gastrointestinal in origin.  This could represent cholecystitis, pancreatitis, peptic ulcer disease.  Also, the differential is abdominal aortic aneurysm.  Angina is much  less likely as the patient has had this pain for several days without  remission and there is clear tenderness to palpation on exam.   PLAN:  We will order cardiac enzymes today to assure normal troponin.  We will also check a CMP, CBC, amylase, and lipase.  I will order an  abdominal ultrasound to rule out AAA and to  rule out cholecystitis.  The  patient is encouraged to make an appointment with his primary care  physician.  He should not take any antiinflammatory medications such as  Advil.  He should increase his omeprazole to twice a day.  He should  also decrease his aspirin to 81 mg daily for now.  Should the patient's  symptoms worsen or develop any associated symptoms such as shortness of  breath or diaphoresis, he should report to the emergency room.     Brayton El, MD  Electronically Signed    SGA/MedQ  DD: 04/21/2009  DT: 04/22/2009  Job #: [id number]

## 2010-05-19 NOTE — Assessment & Plan Note (Signed)
[facility]                        [place] CARDIOLOGY OFFICE NOTE   NAME:[name], [name]                        MRN:          [id number]  DATE:03/17/2009                            DOB:          1927/11/20    PROBLEM LIST:  1. Coronary artery disease status post coronary artery bypass graft in      2001 with a left internal mammary artery to left anterior      descending, sequential vein graft to diagonal 1 and diagonal 2,      vein graft to obtuse marginal 1 and vein graft to the distal right      coronary artery.  Last left heart catheterization in September 2010      showing patent grafts, severe stenosis in the apical portion of the      left anterior descending.  2. Hyperlipidemia.  3. History of symptomatic sinus bradycardia status post pacemaker      placement in September 2010.   INTERVAL HISTORY:  The patient states since his pacemaker placement, he  has felt significantly improved.  Specifically, he denies any chest  discomfort, dyspnea, dizziness or syncope.  He has been compliant with  his medications and is without complaint.  The patient does state that  he was in a squatted position restoring a car and realized that he knee  was pressing against his pacemaker generator.  He had discomfort around  the generator for 2-3 days that subsequently resolved.   PHYSICAL EXAMINATION:  VITAL SIGNS:  His blood pressure is 104/61, pulse  is 66, sating 95% on room air.  He weighs 155 pounds, which is 2-1/2  pounds more than he weighed in December.  GENERAL:  He is in no acute distress.  HEENT:  Normocephalic, atraumatic.  NECK:  Supple.  There is no JVD.  No carotid bruit.  HEART:  Regular rate and rhythm without murmur, rub, or gallop.  LUNGS:  Clear bilaterally.  ABDOMEN:  Soft, nontender, and nondistended.  EXTREMITIES:  Without edema.  SKIN:  Warm and dry.  Pulses are 2+ bilateral radial and posterior  tibial pulses.   Reviewed the  patient's heart catheterization as above in problem list.  The patient's last lipid profile in September 2010 showed an LDL of 75,  HDL of 43, total cholesterol of 133 and triglyceride level of 75.   ASSESSMENT AND PLAN:  1. Coronary artery disease.  The patient is not having any symptoms of      angina.  At this point, I see no need for repeat catheterization      and intervention on the distal left anterior descending lesion as      he is asymptomatic from an ischemia standpoint.  The patient can      decrease his aspirin dose to 162 mg daily.  He should also continue      on his beta-blocker and statin.  Ejection fraction was normal at      the time of heart catheterization.  2. Hyperlipidemia.  His LDL is well controlled but was not at goal of  70, last September.  We will check his fasting lipid profile and      consider increasing his simvastatin to 40 mg based on that.  3. The patient is physically inactive.  We recommend he begin walking      with his wife most days of the week for a 15-30 minutes.  He should      contact our office if any chest pain or shortness of breath      develops.  We will see the patient back in 4 months' time.     Brayton El, MD  Electronically Signed    SGA/MedQ  DD: 03/17/2009  DT: 03/18/2009  Job #: [id number]

## 2010-05-19 NOTE — Assessment & Plan Note (Signed)
[facility]                            CARDIOLOGY OFFICE NOTE   NAME:[name], [name]                        MRN:          [id number]  DATE:08/10/2006                            DOB:          04/08/1927    PRIMARY CARE PHYSICIAN:  Dr. Lianne Bushy.   REASON FOR VISIT:  Routine followup.   HISTORY OF PRESENT ILLNESS:  [name] is a pleasant gentleman, former  patient of Dr. Glennon Hamilton, with history of coronary artery bypass  grafting back in 2001 by  Dr. Tyrone Sage. He has done well overall and is  denying any problems with significant angina or dyspnea on exertion. He  has had ventricular ectopy noted in the past by Dr. Corinda Gubler although in  the setting of preserved left ventricular function systolic function and  no frank syncope. Echocardiogram in February of this year demonstrated  an ejection fraction of 55% to 60% with mild aortic regurgitation, mild-  to-moderate mitral regurgitation, and mild left atrial enlargement. He  had a reassuring Myoview in May of last year as well. Today's  electrocardiogram demonstrates sinus bradycardia at 41 beats per minute.  [name] says that he does not have any major palpitations or syncope,  but does sometimes get a little dizzy when he strains. He had some  recent blood work revealing good LDL control at 68 and normal liver  function test.   ALLERGIES:  No known drug allergies.   PRESENT MEDICATIONS:  1. Enteric coated aspirin 81 mg p.o. daily.  2. Multivitamin 1 p.o. daily.  3. Zocor 20 mg p.o. daily.  4. Desyrel 150 mg p.o. nightly.  5. Prilosec 20 mg p.o. daily.  6. Zoloft 25 mg p.o. daily.  7. Sublingual nitroglycerine p.r.n.   REVIEW OF SYSTEMS:  As described in history of present illness.   PHYSICAL EXAMINATION:  Blood pressure is 147/80, heart rate 44, weight  146 pounds. Patient is comfortable in no acute distress.  HEENT: Normal.  NECK: Supple. No elevated jugular venous pressure. No loud  carotid  bruits.  LUNGS: Clear. Nonlabored breathing at rest.  CARDIAC EXAM: Reveals a regular rate and rhythm, soft systolic murmur at  the apex. No S3 gallop.  ABDOMEN: Soft, no loud bruits. No tenderness.  EXTREMITIES: Exhibit no pitting edema and distal pulses are 2+.   IMPRESSION/RECOMMENDATIONS:  1. Coronary artery disease, status post prior coronary artery bypass      surgery in 2001 with preserved ejection fraction and reassuring      Myoview from May of last year. [name] is symptomatically stable      and we will plan to continue medical therapy with symptom      observation in 6 months.  2. Resting sinus bradycardia. He is not having any frank syncope. He      does get dizzy at times when he strains. I have asked him to keep      an eye on this.  3. Hypertension noted today. Blood pressure has typically been better      controlled. If this becomes more of a regular  reading for him, then      I would anticipate he may need perhaps an ACE inhibitor.  4. Excellent LDL control on Zocor.     [name], MD  Electronically Signed    SGM/MedQ  DD: 08/10/2006  DT: 08/10/2006  Job #: [id number]   cc:   Lianne Bushy, M.D.

## 2010-05-19 NOTE — Assessment & Plan Note (Signed)
[facility]                        [place] CARDIOLOGY OFFICE NOTE   NAME:[name], [name]                        MRN:          [id number]  DATE:08/28/2009                            DOB:          12-22-1927    This is a followup visit.   PROBLEM LIST:  1. Coronary artery disease status post coronary artery bypass graft      surgery in 2001.  Most recent cardiac catheterization was done in      September of 2010, which showed severe native three-vessel coronary      artery disease, patent left internal mammary artery to left      anterior descending coronary artery, patent saphenous vein graft to      distal right coronary artery, patent saphenous vein graft to ramus      and patent sequential saphenous vein graft to first and second      diagonals.  Circumflex distribution was not bypassed.  The native      circumflex had an 80% proximal stenosis that was treated medically      at that time.  Ejection fraction was normal.  2. Sinus node dysfunction status post permanent pacemaker placement.  3. Nonsustained ventricular tachycardia.  4. Hyperlipidemia.   INTERVAL HISTORY:  The patient still feels the same.  He still has  multiple complaints including chest pain, shoulder pain, and back pain.  These are chronic complaints.  He has exertional dyspnea as well.  He is  not exercising regularly.  As I mentioned before, usually cannot give  further details about the discomfort and seems to be a poor historian  about this.  Most of that time, it happens at rest, but it can happen  with activities.  During his last visit, his blood pressure was  relatively low and we stopped amlodipine.  He brought his blood pressure  readings with him and they seem to be within the optimal range.  He had  few readings where the systolic blood pressure was less than 100.   Medications include:  1. Multivitamin once daily.  2. Omeprazole 20 mg daily.  3. Simvastatin 20 mg  daily.  4. Sertraline 50 mg once daily.  5. Metoprolol tartrate 25 mg twice daily.  6. Trazodone 50 mg once daily.  7. Aspirin 81 mg once daily.  8. Imdur 30 mg once daily will be added today.   ALLERGIES:  No known drug allergies.   PHYSICAL EXAMINATION:  VITAL SIGNS:  Weight is 153.2 pounds, blood  pressure is 122/80, pulse is 63, oxygen saturation is 94% on room air.  NECK:  No JVD or carotid bruits.  LUNGS:  Clear to auscultation.  HEART:  Regular rate and rhythm with no gallops or murmurs.  ABDOMEN:  Benign, nontender, nondistended.  EXTREMITIES:  With no clubbing, cyanosis, or edema.   IMPRESSION:  1. Coronary artery disease status post coronary artery bypass graft      surgery:  The patient still with atypical chest pain.  We did a      Lexiscan nuclear stress test which showed no evidence of ischemia  with normal ejection fraction.  His symptoms as I mentioned are      atypical.  I will go ahead and start him on Imdur 30 mg once daily.      I also asked him to start an exercise program 4 times a week.  His      lack of physical activities might be contributing to his multiple      complaints.  We will continue with metoprolol 25 mg twice daily as      well as aspirin 81 mg once daily.  He is already on a statin with      simvastatin.  2. Hypertension:  His low blood pressure has improved after stopping      amlodipine.  We will continue only with the metoprolol.  3. History of nonsustained ventricular tachycardia:  Continue with      metoprolol.  4. Hyperlipidemia:  His lipid profile in March of 2011 was at target      with an LDL of 57.  We will likely repeat in 6 months from now.  He      will follow up with me in 3 months from now or earlier if needed.     Lorine Bears, MD  Electronically Signed    MA/MedQ  DD: 08/28/2009  DT: 08/28/2009  Job #: [id number]

## 2010-05-19 NOTE — Assessment & Plan Note (Signed)
[name]                        [name] OFFICE NOTE   NAME:[name], [name]                        MRN:          [id number]  DATE:07/18/2009                            DOB:          March 30, 1927    [name] is seen in followup for coronary disease, sinus node  dysfunction with previously implanted pacemaker.  He has been doing  quite well.  He had some problem with his belly a couple months ago.  These issues resolve.  At that time, he underwent an echo which  demonstrated normal left ventricular function.   He has a little bit of exercise intolerance but mostly at the end of a  day.  He denies significant sluggishness.   His medications include metoprolol 25 b.i.d., trazodone, aspirin,  amlodipine 2.5, sertraline, simvastatin.   PHYSICAL EXAMINATION:  VITAL SIGNS:  Today, his blood pressure was  125/80, his weight was 154 which is stable, his pulse was 61.  NECK:  His neck veins were flat.  LUNGS:  Clear.  HEART:  Heart sounds were regular.  ABDOMEN:  Soft.  EXTREMITIES:  Without edema.  NEUROLOGIC:  Grossly normal apart from hearing aids.   Interrogation of his [employer] adaptive pulse generator demonstrated  that he is atrially paced about 86% at a time.  He has reasonable heart  rate excursion.  He did have 1 episode of nonsustained ventricular  tachycardia.  There was no mode switch episodes.  His atrial impedance  was 511.  Ventricular impedance was 1079.  R-wave was 16.  There was no  significant intrinsic P-wave and thresholds were normal, although I do  not have recorded.   IMPRESSION:  1. Sinus node dysfunction.  2. Status post pacer for the above.  3. Ventricular tachycardia identified by electrograms.  4. Ischemic heart disease with,      a.     Prior bypass.      b.     Normal left ventricular function.  5. Fatigue.   [name] is doing very well.  We will back down on his metoprolol to  12.5 twice a day to see  if that does not have some impact on his  fatigue.   His nonsustained ventricular tachycardia is not an issue at this point  given his normal left ventricular function.  We will plan to see him  again in 12 months' time and he will see Dr. Kirke [name] in 6 months' time.     [name] Salvia, MD, [facility]  Electronically Signed    SCK/MedQ  DD: 07/18/2009  DT: 07/19/2009  Job #: [id number]

## 2010-05-19 NOTE — Assessment & Plan Note (Signed)
[facility]                            CARDIOLOGY OFFICE NOTE   NAME:[name], [name]                        MRN:          [id number]  DATE:02/23/2007                            DOB:          1927-01-31    PRIMARY CARE PHYSICIAN:  Dr. Lianne Bushy.   REASON FOR VISIT:  Routine cardiac follow-up.   HISTORY OF PRESENT ILLNESS:  [name] is back in for 31-month visit.  He  is doing very well without any significant angina or limiting dyspnea.  He remains bradycardic, although asymptomatic.  The electrocardiogram  shows sinus bradycardia at 47 beats per minute.  Otherwise with normal  intervals.  He reports his cholesterol has been followed since I saw him  and that everything was in order.  Medical regimen is outlined below.  I  talked with him today about basic risk factor modification strategies  and will likely go to an annual visit,  given his clinical stability.   ALLERGIES:  No known drug allergies.   MEDICATIONS:  Enteric-coated aspirin 81 mg p.o. daily, Zocor 20 mg p.o.  daily, multivitamin one p.o. daily, Desyrel 150 mg p.o. nightly,  Prilosec 20 mg p.o. daily, Zoloft 25 mg p.o. daily, sublingual  nitroglycerin 0.4 mg p.r.n.   REVIEW OF SYSTEMS:  As in the history of present illness.   EXAMINATION:  Blood pressure is 135/74 heart rate is 47, weight 152  pounds of from 146.  The patient is comfortable in no acute distress.  Examination neck reveals no elevated venous pressure no loud bruits.  LUNGS:  Clear without labored breathing.  CARDIAC:  Exam was a regular rate and rhythm.  No loud murmur or gallop.  EXTREMITIES:  Show no significant pitting edema.   IMPRESSION/RECOMMENDATIONS:  1. Coronary disease status post coronary bypass grafting in 2001 with      preserved ejection fraction and reassuring Myoview in May 2007.      The patient is symptomatically stable.  We will go to an annual      visit on medical therapy.  Assuming he  developed no symptoms in the      interim.  2. Resting sinus bradycardia, asymptomatic.  Will continue to observe.     [name], MD  Electronically Signed   SGM/MedQ  DD: 02/23/2007  DT: 02/24/2007  Job #: [id number]   cc:   Lianne Bushy, M.D.

## 2010-05-22 NOTE — Op Note (Signed)
   NAME:  [name], [name]                           ACCOUNT NO.:  [id number]   MEDICAL RECORD NO.:  [id number]                   PATIENT TYPE:  AMB   LOCATION:  [facility]                                 FACILITY:  [facility]   PHYSICIAN:  Sigmund I. Patsi Sears, M.D.         DATE OF BIRTH:  Jul 08, 1927   DATE OF PROCEDURE:  10/29/2002  DATE OF DISCHARGE:                                 OPERATIVE REPORT   PREOPERATIVE DIAGNOSIS:  Bladder stone.   POSTOPERATIVE DIAGNOSIS:  Bladder stone.   OPERATION:  1. Cystourethroscopy.  2. TUR imbedded bladder base bladder stone.   SURGEON:  Sigmund I. Patsi Sears, M.D.   ASSISTANT:  Terri Piedra, N.P.   PREPARATION:  After appropriate preanesthesia, the patient is brought to the  operating room and placed on the operating table in the dorsal supine  position where general LMA anesthesia was introduced.  He was then re-placed  in the dorsal lithotomy position where the pubis was prepped with Betadine  solution and draped in the usual fashion.   DESCRIPTION OF PROCEDURE:  Cystoscopy revealed a mass at the base of the  bladder, with some birefringents, and blood at the surface.  This mass could  not be cold cup biopsied and therefore, the patient underwent TUR biopsy of  the mass, which was then revealed to be imbedded stone.  This was removed in  pieces.  The base was cauterized, and no bleeding was noted.  Xylocaine  jelly was then placed in the urethra, and the patient was awakened and taken  to the recovery room in good condition.                                               Sigmund I. Patsi Sears, M.D.    SIT/MEDQ  D:  10/29/2002  T:  10/29/2002  Job:  [id number]

## 2010-05-22 NOTE — Letter (Signed)
February 16, 2006    Lianne Bushy, M.D.  [address]  [place], [place] [address]   RE:  CEM, KOSMAN  MRN:  [id number]  /  DOB:  29-May-1927   Dear Michele Mcalpine:   It is a pleasure to see this patient, [name], for followup on  February 16, 2006, seven years postop CABG by Dr. Ofilia Neas.  The  patient has been getting along well. He did have some ventricular ectopy  and 3-beat VT on a stress test May 04, 2005.  There were only borderline  ST changes.   The patient has had rare palpitations and had no chest discomfort,  shortness of breath, or presyncope.  At that time, we suggested a  Myoview; however, the patient was quite reluctant.   PRESENT MEDICATIONS:  1. Aspirin 81.  2. Zocor  20.  3. Desyrel.  4. Prilosec 20.  5. Zoloft 25.   Most recent stress Cardiolite September 17, 2003, revealed no ischemia.   PHYSICAL EXAMINATION:  VITAL SIGNS: Blood pressure 118/64, pulse 48,  sinus bradycardia.  GENERAL APPEARANCE:  Normal.  NECK:  JVP not elevated.  Carotid pulses bilaterally without bruits.  LUNGS: Clear.  CARDIAC: Exam reveals 2/6 short systolic half frequency murmur at the  apex, not increased with Valsalva.  I did not hear this at the aorta and  do not remember hearing it before.  ABDOMEN: Normal.  EXTREMITIES: Normal.   IMPRESSION:  1. Coronary artery disease 7 years status post coronary artery bypass      grafting, stable.  2. Hyperlipidemia, treated.  3. Sinus bradycardia on no beta blockers.  4. Ventricular ectopy, previously noted.  5. Systolic murmur at the apex, possibly aortic sclerosis or septal      hypertrophy.   EKG reveals moderate sinus bradycardia, otherwise normal.   SUGGESTIONS:  1. Patient continue on same therapy.  2. Labs are pending.  3. I suggest he return in 6 months to see my associate, Dr. Simona Huh, for followup.   Thank you for the opportunity to see this nice gentleman.    Sincerely,      E. Graceann Congress, MD,  [facility]  Electronically Signed    EJL/MedQ  DD: 02/16/2006  DT: 02/16/2006  Job #: [id number]

## 2010-05-22 NOTE — H&P (Signed)
[place]. [facility]  Patient:    [name]                        MRN: [id number] Adm. Date:  [id number] Attending:  Pricilla Riffle Dictator:   Abelino Derrick, P.A.C. [facility]                         History and Physical  CHIEF COMPLAINT:  Chest pain.  HISTORY OF PRESENT ILLNESS:  [name] is a 77 year old male with a long history of chest pain and previous negative exercise test in the past.  He woke up this morning with substernal chest pain associated with pain down both his arms and ome nausea.  He waited until about 6:45 to ask his wife to take him to the emergency room.  In the emergency room, he became bradycardic when his heart rate dropped in the 20s.  He was treated with atropine.  His EKG showed lateral ST elevation and he was given TNK.  Eventually, his symptoms resolved.  He was also treated with aspirin, heparin, and nitroglycerin and transferred to [facility] for urgent evaluation. On arrival here, he is essentially pain free.  His EKG still shows some slight residual lateral ST elevation.  His heart rate is in the 50s.  His blood pressure is stable.  PAST MEDICAL HISTORY:  Kidney stones and BPH.  There is no history of diabetes,  hypertension, or hyperlipidemia.  CURRENT MEDICATIONS: 1. Zoloft 50 mg a.s. 2. Trazodone 75 mg h.s. 3. Zantac.  ALLERGIES:  He has no known drug allergies.  SOCIAL HISTORY:  He is married and he is a nonsmoker.  He has never smoked.  He  does not drink alcohol.  FAMILY HISTORY:  Remarkable for coronary disease.  His father died in his 26s of an MI.  He has one brother who died at 52 and one sister who died in her 34s of an MI.  REVIEW OF SYSTEMS:  Essentially unremarkable except for as noted above.  There s no history of GI bleeding or peptic ulcer disease.  He has no history of thyroid disease.  There is no history of renal failure.  PHYSICAL EXAMINATION:  VITAL SIGNS:  Blood pressure 112/60,  pulse 58, respirations 18.  GENERAL:  He is a well-developed, well-nourished male in no acute distress.  HEENT:  Normocephalic.  Extraocular movements are intact.  Sclerae is non-icteric. Lids and conjunctivae are within normal limits.  NECK:  Without bruits and without JVD.  CHEST:  Clear to auscultation and percussion.  CARDIAC:  Regular rate and rhythm without murmurs, rubs, or gallops.  Normal S1, S2.  ABDOMEN:  Nontender.  No hepatosplenomegaly.  No bruits.  EXTREMITIES:  Without edema.  Distal pulses are intact.  There are no femoral artery bruits noted.  NEUROLOGIC:  Grossly intact.  He is awake, alert, oriented, and cooperative.  LABORATORY DATA:  Negative CK and troponin initially.  White count 8.1, hemoglobin 13.6, hematocrit 41.3, platelet count 215.  INR 1.17.  Sodium 140, potassium 4.1, BUN 18, creatinine 1.3.  IMPRESSION: 1. Acute lateral myocardial infarction with bradycardia treated with TNK. 2. Strong family history of coronary disease. 3. History of benign prostatic hypertrophy. 4. History of nephrolithiasis.  PLAN:  We will continue heparin and nitrates.  The patient will be taken to the  catheterization lab later today. DD:  03/02/99 TD:  03/02/99 Job: [id number]

## 2010-05-22 NOTE — Procedures (Signed)
[place]. [facility]  Patient:    [name], [name]                       MRN: [id number] Proc. Date: 03/06/99 Attending:  Edwin Cap. Zoila Shutter, M.D. Dictator:   Edwin Cap. Zoila Shutter, M.D. CC:         Gwenith Daily. Tyrone Sage, M.D.             Dept. of Anesthesia, Dr. Zoila Shutter                           Procedure Report  SURGEON:  Dr. Sheliah Plane.  HISTORY OF PRESENT ILLNESS:  Mr. Schulenburg is a 77 year old white male with the diagnosis of coronary artery disease who was scheduled for coronary artery bypass grafting.  At the early phases of the operative procedure the myocardial function was estimated to be subnormal, and Dr. Tyrone Sage requested echocardiographic evaluation at that point in time.  DESCRIPTION OF PROCEDURE:  The biplane echocardiographic probe was obtained and  passed transorally into the patients gastrum.  The probe beam directed cephalad  and the interrogation begun.  Beginning at the apex, multiple short access views were taken demonstrating global and concentric mild myocardial hypertrophy with  slightly diminished global contractility, and an ejection fraction estimated to be around 40 to 50% with significantly incomplete ______ up the left ventricular cavity.  The mitral valve was then interrogated per request of Dr. Tyrone Sage, and the mitral valve ring was noted to be dilated with flattening of the leaflet, and a mild to moderate central regurgitant jet was noted.  The aortic valve was interrogated, but due to artifact with the machine, no accurate evaluation could be given of flow characteristics.  Anatomically, without Doppler flow, it looked within normal limits.  The procedure then began and the probe was put on standby. At the termination of the procedure the heart was refilled, restarted, and a repeat interrogation was done.  This time, the left ventricular contractility was demonstrated to be apparently within normal limits  with a significantly increased stroke volume and ejection fraction.  Contractility was global with no wall motion abnormalities noted.  The mitral valve was reinterrogated and once again demonstrated moderate regurgitation with the jet central reaching the back wall and slight reverse flow in the superior pulmonary vein per pulse wave Doppler.  The  intraventricular and intra-atrial septi were intact.  The right ventricular function was apparently normal.  FINAL DIAGNOSES:  Coronary artery disease with successful coronary artery bypass grafting and improvement in global cardiac function.  Mild to moderate mitral regurgitation, unchanged from preoperatively.  The probe was then removed. DD:  03/06/99 TD:  03/09/99 Job: [id number]

## 2010-05-22 NOTE — Cardiovascular Report (Signed)
[place]. [facility]  Patient:    [name], [name]                        MRN: [id number] Proc. Date: 03/02/99 Adm. Date:  [id number] Attending:  Pricilla Riffle CC:         Maricela Bo, M.D.             Cardiac Catheterization Laboratory                        Cardiac Catheterization  PROCEDURES PERFORMED:  Selective coronary angiography, left ventricular angiography, LIMA angiography, abdominal aortography - Judkins technique.  INDICATIONS:  The patient is a 77 year old retired white married male from [place], [place], with a long history of chest pain, having awakened this morning  with severe prolonged pain.  He presented to the [facility] Emergency Room with acute anterolateral myocardial infarction which was treated with TNK with almost complete relief of symptoms.  On arrival here, he still had peaking of the T-waves in the anterolateral leads and had some left shoulder discomfort.  It was felt that the patient needed to have urgent angiography.  RESULTS:  HEMODYNAMICS:  Left ventricular pressure 154/23, AO 154/74.  ANGIOGRAPHY:  There is heavy calcification in the proximal LAD and circumflex.   1. The left main coronary artery is normal.  2. The left anterior descending artery had 70% first diagonal, 70% segmental    stenosis before the first septal, and then a 95% focal lesion after the first    septal.  The second diagonal had an 80% lesion.  3. The circumflex had a 90% lesion in the moderate sized OM, and 90% lesion in    the AV circumflex.  4. The right coronary artery had segmental 90% to 95% stenosis proximally.  The    distal vessels appeared quite good.  LEFT VENTRICULOGRAPHY:  The left ventricle revealed hypokinesis of the apex which improved significantly after PVC.  LIMA:  The LIMA was patent.  ABDOMINAL AORTOGRAM:  The abdominal aortogram revealed 30% lesions in the left renal artery.  There appears to be a  dual collecting system on the left.  The abdominal aorta was normal.  SUMMARY:  This is a patient with acute anterior wall myocardial infarction which was treated successfully with thrombolytic therapy.  He now has evidence of severe three-vessel coronary artery disease with moderate left ventricular dysfunction  with apical hypokinesis which improve after PVCs.  RECOMMENDATIONS:  The patient was reviewed with Dr. Juanda Chance and it was felt that  CABG would be the best therapeutic option. DD:  03/02/99 TD:  03/02/99 Job: [id number]

## 2010-05-22 NOTE — Discharge Summary (Signed)
[place]. [facility]  Patient:    [name], [name]                        MRN: [id number] Adm. Date:  [id number] Disc. Date: [id number] Attending:  Waldo Laine Dictator:   Eugenia Pancoast, P.A. CC:         Gwenith Daily Tyrone Sage, M.D.             Cecil Cranker, M.D. [facility]                           Discharge Summary  DATE OF BIRTH:  1927/05/12  FINAL DIAGNOSES: 1. Coronary artery disease. 2. Bradycardia. 3. Benign prostatic hypertrophy. 4. Presbycusis.  PROCEDURE:  March 02, 1999, left heart catheterization per Dr. Glennon Hamilton.  March 06, 1999, coronary artery bypass x 5 of the left internal mammary artery to the left anterior descending, sequential saphenous vein graft to the first and second diagonal branch, saphenous vein graft to the obtuse marginal, and saphenous vein graft to the right coronary artery.  SURGEON:  Gwenith Daily. Tyrone Sage, M.D.  HISTORY OF PRESENT ILLNESS:  This is a 77 year old gentleman with a long history of chest pain and previous negative exercise test in the past.  Patient woke up on the morning of admission with substernal chest pain associated with pain down both is arms and some nausea.  He waited until about 6:45 a.m. to ask his wife to take im to the emergency room.  In the emergency room, he became bradycardic when his heart rate dropped in the 20s.  He was treated with atropine.  His EKG showed lateral ST elevation and was given TNK.  Eventually, his symptoms resolved.  He was also treated with aspirin, heparin, and nitroglycerin, and transferred to [facility] for urgent evaluation.  On arrival here, he was essentially pain-free.  His EKG still shows some slight residual lateral ST wave elevation.  Patient had been admitted there in [facility], then transferred to [facility].  HOSPITAL COURSE:  Patient transferred to [facility] on March 02, 1999. At that  time, he was admitted to the [facility] group.  He subsequently was started on heparin per pharmacy protocol.  Then, he was scheduled and underwent left heart  catheterization per Dr. Glennon Hamilton.  At that time, it showed significant three-vessel disease.  Because of these findings and his chest pain, he was referred to Dr. Tyrone Sage for surgical revascularization.  Dr. Tyrone Sage saw the patient and discussed the surgery.  Risks and benefits were explained.  Patient  understood and agreed to surgery.  On March 06, 1999, the patient underwent coronary artery bypass graft surgery x 5 with the LIMA to the LAD, a sequential saphenous vein graft to the first diagonal and second diagonal branches, a saphenous vein  graft to the obtuse marginal branch, and saphenous vein graft to the right coronary artery.  Patient tolerated the procedure well with no intraoperative complications. Postoperatively, the patient was satisfactorily initially.  He was extubated on the operative day and continued in a progressive satisfactory manner.  Patient had een on Dopamine postoperatively and this was weaned by March 09, 1999.  He was transferred to the step-down unit and there he continued to progress satisfactorily. His heart rate was noted to be in the 60s to 70s initially.  He  subsequently had an episode  of significant bradycardia and, because of this, he had been on beta blockers but these were discontinued.  Patient continued to progress after that satisfactorily.  No more untoward events occurred after the episode f bradycardia. He did quite well.  He went through his cardiac phase satisfactorily. His rhythm remained sinus rhythm in the 60s to 70s, and he was prepared for discharge.  DISCHARGE MEDICATIONS: 1. Trazodone 75 mg h.s. 2. Zoloft 50 mg q.d. 3. Enteric-coated aspirin 325 mg q.d. 4. Darvocet N-100 one to two p.o. q.4-6h. p.r.n. pain.  FOLLOW-UP:  Patient will follow up with Dr. Corinda Gubler  on March 27, 1999, at 12:30  p.m.  He will come to Dr. Ysidro Evert office on Thursday, March 29, at 11:20 a.m.   CONDITION ON DISCHARGE:  Patient is subsequently discharged in satisfactory and  stable condition on March 14, 1999. DD:  03/13/99 TD:  03/14/99 Job: [id number]

## 2010-07-20 ENCOUNTER — Encounter: Payer: Self-pay | Admitting: Internal Medicine

## 2010-07-20 ENCOUNTER — Ambulatory Visit (INDEPENDENT_AMBULATORY_CARE_PROVIDER_SITE_OTHER): Payer: Medicare Other | Admitting: Internal Medicine

## 2010-07-20 DIAGNOSIS — I2510 Atherosclerotic heart disease of native coronary artery without angina pectoris: Secondary | ICD-10-CM

## 2010-07-20 DIAGNOSIS — R001 Bradycardia, unspecified: Secondary | ICD-10-CM

## 2010-07-20 DIAGNOSIS — R519 Headache, unspecified: Secondary | ICD-10-CM | POA: Insufficient documentation

## 2010-07-20 DIAGNOSIS — R51 Headache: Secondary | ICD-10-CM

## 2010-07-20 DIAGNOSIS — I495 Sick sinus syndrome: Secondary | ICD-10-CM

## 2010-07-20 DIAGNOSIS — I498 Other specified cardiac arrhythmias: Secondary | ICD-10-CM

## 2010-07-20 DIAGNOSIS — Z950 Presence of cardiac pacemaker: Secondary | ICD-10-CM | POA: Insufficient documentation

## 2010-07-20 LAB — PACEMAKER DEVICE OBSERVATION
AL AMPLITUDE: 2.8 mv
AL IMPEDENCE PM: 511 Ohm
AL THRESHOLD: 0.875 v
ATRIAL PACING PM: 85
BAMS-0001: 175 {beats}/min
BATTERY VOLTAGE: 2.79 v
BAVD-0001: 120 ms
BAVD-0002: 150 ms
BMOD-0003: 30
BMOD-0005: 95 {beats}/min
BPAC-0002RA: 1.75 v
BPAC-0002RV: 2 v
BPAC-0003RA: 0.4 ms
BPAC-0003RV: 0.4 ms
BRDY-0002: 60 {beats}/min
BRDY-0004: 120 {beats}/min
BSEN-0002RA: 0.5 mv
BSEN-0002RV: 5.6 mv
BSEN-0004RV: 230 ms
BSEN-0005RA: 180 ms
BSEN-0005RV: 28 ms
DEV-0004LDO: 5076
DEV-0004LDO: 5076
DEV-0006LDO: 20100910
DEV-0006LDO: 20100910
DEV-0006PM: 20100910
DEV-0020PM: NEGATIVE
DEV-0023LDO: 0
DEV-0023LDO: 0
DEV-0023PM: 0
DEV-0024PM: 85
DEV-0025PM: 65
EVAL-0001E4: 20120716093100
EVAL-0003E4: NEGATIVE
RV LEAD AMPLITUDE: 22.4 mv
RV LEAD IMPEDENCE PM: 904 Ohm
RV LEAD THRESHOLD: 0.5 v
TCAP-0003RA: 0.4 ms
TCAP-0003RV: 0.4 ms
TEL-0014: 0.132 Ohm
VENTRICULAR PACING PM: 0

## 2010-07-20 NOTE — Assessment & Plan Note (Signed)
Stable on current medications 

## 2010-07-20 NOTE — Patient Instructions (Signed)
Your physician recommends that you schedule a follow-up appointment in: 1 year  

## 2010-07-20 NOTE — Assessment & Plan Note (Signed)
S. Contacted [facility] on the patient's past and set up(reiterated) referral

## 2010-07-20 NOTE — Assessment & Plan Note (Signed)
Stable in the 100% atrial pacing and good histogram

## 2010-07-20 NOTE — Progress Notes (Signed)
  HPI  [name] is a 77 y.o. male  Seen in followup for a pacemaker implanted September 2010 for symptomatic bradycardia.  He has a history of coronary artery disease with prior bypass surgery and PCI of the circumflex lesion September 2010. Because of symptoms of exertional shortness of breath and chest pain that was somewhat atypical he underwent a lexiscan stress testing August 2011 demonstrating normal left ventricular function and no ischemia  His major Complaint is a headache which has been going on since last August. Multiple drugs have been looked at in testing undertaken. He wakes a referral to the [facility] in [place]. Past Medical History  Diagnosis Date  . Sinus node dysfunction     s/p PPM  . Ventricular tachycardia, non-sustained   . CAD (coronary artery disease)     s/p CABG in 2001  . HLD (hyperlipidemia)     Past Surgical History  Procedure Date  . Bladder stone removal 2005  . Pacemaker insertion   . Coronary artery bypass graft 2001    LIMA to LAD, SVG to RCA, SVG to ramus, sequential SVG to  first and second diagonal.  . Cardiac catheterization 09/2008    patent grafts. 80% proximal stneosis in left circumflex artery (subsequent stress test showed no ischemia)    Current Outpatient Prescriptions  Medication Sig Dispense Refill  . aspirin 81 MG tablet Take 81 mg by mouth daily.        . divalproex (DEPAKOTE) 250 MG 24 hr tablet Take 1 tablet by mouth at bedtime.      . metoprolol tartrate (LOPRESSOR) 25 MG tablet Take 12.5 mg by mouth 2 (two) times daily.       . mirtazapine (REMERON) 30 MG tablet Take 0.5 tablets by mouth At bedtime.      . Multiple Vitamin (MULTIVITAMIN) tablet Take 1 tablet by mouth daily.        . nitroGLYCERIN (NITROSTAT) 0.4 MG SL tablet Place 0.4 mg under the tongue every 5 (five) minutes as needed.        Marland Kitchen omeprazole (PRILOSEC) 20 MG capsule Take 20 mg by mouth daily.       . sertraline (ZOLOFT) 25 MG tablet  Take 25 mg by mouth daily.          No Known Allergies  Review of Systems negative except from HPI and PMH  Physical Exam Well developed and well nourished in no acute distress HENT normal E scleral and icterus clear Neck Supple JVP flat; carotids brisk and full Clear to ausculation Regular rate and rhythm, no murmurs gallops or rub Soft with active bowel sounds No clubbing cyanosis and edema Alert and oriented, grossly normal motor and sensory function Skin Warm and Dry Assessment and  Plan

## 2010-07-20 NOTE — Assessment & Plan Note (Signed)
The patient's device was interrogated.  The information was reviewed. No changes were made in the programming.    

## 2010-08-31 ENCOUNTER — Encounter: Payer: Self-pay | Admitting: Cardiovascular Disease

## 2010-10-01 ENCOUNTER — Other Ambulatory Visit: Payer: Self-pay | Admitting: Cardiovascular Disease

## 2010-10-22 ENCOUNTER — Encounter: Payer: Self-pay | Admitting: Internal Medicine

## 2010-10-22 ENCOUNTER — Other Ambulatory Visit: Payer: Self-pay | Admitting: Internal Medicine

## 2010-10-22 ENCOUNTER — Ambulatory Visit (INDEPENDENT_AMBULATORY_CARE_PROVIDER_SITE_OTHER): Payer: Medicare Other | Admitting: *Deleted

## 2010-10-22 DIAGNOSIS — I495 Sick sinus syndrome: Secondary | ICD-10-CM

## 2010-10-22 DIAGNOSIS — Z950 Presence of cardiac pacemaker: Secondary | ICD-10-CM

## 2010-10-22 DIAGNOSIS — R001 Bradycardia, unspecified: Secondary | ICD-10-CM

## 2010-10-22 DIAGNOSIS — I498 Other specified cardiac arrhythmias: Secondary | ICD-10-CM

## 2010-10-25 LAB — REMOTE PACEMAKER DEVICE
AL IMPEDENCE PM: 483 Ohm
AL THRESHOLD: 0.625 V
BATTERY VOLTAGE: 2.8 V
RV LEAD AMPLITUDE: 22.4 mv
RV LEAD IMPEDENCE PM: 968 Ohm

## 2010-11-03 ENCOUNTER — Encounter: Payer: Self-pay | Admitting: *Deleted

## 2010-11-03 NOTE — Progress Notes (Signed)
Pacer remote check  

## 2010-12-15 ENCOUNTER — Ambulatory Visit: Payer: Self-pay | Admitting: Ophthalmology

## 2011-01-21 ENCOUNTER — Encounter: Payer: Self-pay | Admitting: Internal Medicine

## 2011-01-21 ENCOUNTER — Ambulatory Visit (INDEPENDENT_AMBULATORY_CARE_PROVIDER_SITE_OTHER): Payer: Medicare Other | Admitting: *Deleted

## 2011-01-21 DIAGNOSIS — Z950 Presence of cardiac pacemaker: Secondary | ICD-10-CM

## 2011-01-21 DIAGNOSIS — I498 Other specified cardiac arrhythmias: Secondary | ICD-10-CM

## 2011-01-21 DIAGNOSIS — R001 Bradycardia, unspecified: Secondary | ICD-10-CM

## 2011-01-24 LAB — REMOTE PACEMAKER DEVICE
AL THRESHOLD: 0.75 V
BAMS-0001: 175 {beats}/min
RV LEAD AMPLITUDE: 22.4 mv

## 2011-01-26 ENCOUNTER — Encounter: Payer: Self-pay | Admitting: *Deleted

## 2011-02-01 NOTE — Progress Notes (Signed)
Remote pacer check  

## 2011-04-06 ENCOUNTER — Ambulatory Visit: Payer: Self-pay | Admitting: Ophthalmology

## 2011-04-20 DIAGNOSIS — G4730 Sleep apnea, unspecified: Secondary | ICD-10-CM

## 2011-04-20 HISTORY — DX: Sleep apnea, unspecified: G47.30

## 2011-04-22 ENCOUNTER — Ambulatory Visit (INDEPENDENT_AMBULATORY_CARE_PROVIDER_SITE_OTHER): Payer: Medicare Other | Admitting: *Deleted

## 2011-04-22 ENCOUNTER — Encounter: Payer: Self-pay | Admitting: Internal Medicine

## 2011-04-22 DIAGNOSIS — R001 Bradycardia, unspecified: Secondary | ICD-10-CM

## 2011-04-22 DIAGNOSIS — I498 Other specified cardiac arrhythmias: Secondary | ICD-10-CM

## 2011-04-22 DIAGNOSIS — I495 Sick sinus syndrome: Secondary | ICD-10-CM

## 2011-04-23 LAB — REMOTE PACEMAKER DEVICE
AL AMPLITUDE: 2.8 mv
AL IMPEDENCE PM: 475 Ohm
BATTERY VOLTAGE: 2.8 V
RV LEAD IMPEDENCE PM: 854 Ohm
VENTRICULAR PACING PM: 0

## 2011-05-03 NOTE — Progress Notes (Signed)
Remote pacer check  

## 2011-05-25 ENCOUNTER — Telehealth: Payer: Self-pay | Admitting: Internal Medicine

## 2011-05-25 MED ORDER — METOPROLOL TARTRATE 25 MG PO TABS: 25.0000 mg | ORAL_TABLET | Freq: Two times a day (BID) | ORAL | Status: DC

## 2011-05-25 NOTE — Telephone Encounter (Signed)
Pt needs refill of metoprolol 25mg  ,[facility] [place], for 90 day supply

## 2011-06-25 ENCOUNTER — Ambulatory Visit (INDEPENDENT_AMBULATORY_CARE_PROVIDER_SITE_OTHER): Payer: Medicare Other | Admitting: Cardiovascular Disease

## 2011-06-25 ENCOUNTER — Encounter: Payer: Self-pay | Admitting: Cardiovascular Disease

## 2011-06-25 VITALS — BP 110/64 | HR 62 | Ht 66.0 in | Wt 152.0 lb

## 2011-06-25 DIAGNOSIS — I498 Other specified cardiac arrhythmias: Secondary | ICD-10-CM

## 2011-06-25 DIAGNOSIS — E785 Hyperlipidemia, unspecified: Secondary | ICD-10-CM

## 2011-06-25 DIAGNOSIS — R001 Bradycardia, unspecified: Secondary | ICD-10-CM

## 2011-06-25 DIAGNOSIS — I2510 Atherosclerotic heart disease of native coronary artery without angina pectoris: Secondary | ICD-10-CM

## 2011-06-25 DIAGNOSIS — Z950 Presence of cardiac pacemaker: Secondary | ICD-10-CM

## 2011-06-25 MED ORDER — SIMVASTATIN 20 MG PO TABS: 20.0000 mg | ORAL_TABLET | Freq: Every day | ORAL | Status: DC

## 2011-06-25 NOTE — Assessment & Plan Note (Signed)
Not clear to me why simvastatin was discontinued. I recommend resuming this at 20 mg daily. I recommend a followup lipid and liver profile in 6 weeks.

## 2011-06-25 NOTE — Progress Notes (Signed)
HPI  [name] is an 78 year old gentleman who is here today for an annual followup visit. Overall he has been stable from a cardiac standpoint with no reported symptoms of chest pain, dyspnea, palpitations or dizziness. He has known history of coronary artery disease status post CABG. Most recent cardiac catheterization in 2010 showed patent grafts with 80% ostial left circumflex stenosis. Subsequent nuclear stress test in 2011 showed no evidence of ischemia with normal ejection fraction. He recently had an episode of chest pain that required going to the emergency room. He was doing some work in the yard and pulled something heavy. Later that day he started having sternal aching which did not improve with 2 nitroglycerin. His blood pressure dropped and thus he went to the emergency room. He had negative cardiac enzymes. No chest pain since then. The simvastatin has been discontinued for unclear reasons. He does not report any side effects that it's possible that it was stopped because of his unexplained daily headaches.  He continues to suffer from daily headaches and has seen multiple neurologists for this. No Known Allergies   Current Outpatient Prescriptions on File Prior to Visit  Medication Sig Dispense Refill  . aspirin 81 MG tablet Take 81 mg by mouth daily.        Marland Kitchen gabapentin (NEURONTIN) 300 MG capsule Takes 2 tablet at bedtime daily.      . metoprolol tartrate (LOPRESSOR) 25 MG tablet Take 1 tablet (25 mg total) by mouth 2 (two) times daily.  90 tablet  0  . Multiple Vitamin (MULTIVITAMIN) tablet Take 1 tablet by mouth daily.        . nitroGLYCERIN (NITROSTAT) 0.4 MG SL tablet Place 0.4 mg under the tongue every 5 (five) minutes as needed.        Marland Kitchen omeprazole (PRILOSEC) 20 MG capsule Take 20 mg by mouth daily.       . promethazine (PHENERGAN) 25 MG tablet Takes 1-2 tablets tid as needed.      . traZODone (DESYREL) 150 MG tablet Take 150 mg by mouth at bedtime.      . simvastatin  (ZOCOR) 20 MG tablet Take 1 tablet (20 mg total) by mouth at bedtime.  30 tablet  11     Past Medical History  Diagnosis Date  . Sinus node dysfunction     s/p PPM  . Ventricular tachycardia, non-sustained   . HLD (hyperlipidemia)   . Pacemaker   . CAD (coronary artery disease)     s/p CABG in 2001; lexiscan 2011  no ischemia and nl  LV function     Past Surgical History  Procedure Date  . Bladder stone removal 2005  . Pacemaker insertion   . Coronary artery bypass graft 2001    LIMA to LAD, SVG to RCA, SVG to ramus, sequential SVG to  first and second diagonal.  . Insert / replace / remove pacemaker   . Cataract surgery     bilateral  . Cardiac catheterization 09/2008    patent grafts. 80% proximal stneosis in left circumflex artery (subsequent stress test showed no ischemia)     Family History  Problem Relation Age of Onset  . Heart attack       History   Social History  . Marital Status: Married    Spouse Name: N/A    Number of Children: 1  . Years of Education: N/A   Occupational History  . retired    Social History Main Topics  .  Smoking status: Never Smoker   . Smokeless tobacco: Not on file  . Alcohol Use: No  . Drug Use: No  . Sexually Active: Not on file   Other Topics Concern  . Not on file   Social History Narrative  . No narrative on file     PHYSICAL EXAM   BP 110/64  Pulse 62  Ht 5\' 6"  (1.676 m)  Wt 152 lb (68.947 kg)  BMI 24.53 kg/m2 Constitutional: He is oriented to person, place, and time. He appears well-developed and well-nourished. No distress.  HENT: No nasal discharge.  Head: Normocephalic and atraumatic.  Eyes: Pupils are equal and round. Right eye exhibits no discharge. Left eye exhibits no discharge.  Neck: Normal range of motion. Neck supple. No JVD present. No thyromegaly present.  Cardiovascular: Normal rate, regular rhythm, normal heart sounds and. Exam reveals no gallop and no friction rub. No murmur heard.    Pulmonary/Chest: Effort normal and breath sounds normal. No stridor. No respiratory distress. He has no wheezes. He has no rales. He exhibits no tenderness.  Abdominal: Soft. Bowel sounds are normal. He exhibits no distension. There is no tenderness. There is no rebound and no guarding.  Musculoskeletal: Normal range of motion. He exhibits no edema and no tenderness.  Neurological: He is alert and oriented to person, place, and time. Coordination normal.  Skin: Skin is warm and dry. No rash noted. He is not diaphoretic. No erythema. No pallor.  Psychiatric: He has a normal mood and affect. His behavior is normal. Judgment and thought content normal.       EKG: Atrial paced rhythm. With the magnet on, he converts to AV sequential pacing.   ASSESSMENT AND PLAN

## 2011-06-25 NOTE — Assessment & Plan Note (Signed)
He seems to be stable from a cardiac standpoint. Recent chest pain episode that he had was likely musculoskeletal. No further chest pain since then. Continue medical therapy. Most recent cardiac catheterization in 2010 showed patent grafts and nuclear stress test in 2011 was normal.

## 2011-06-25 NOTE — Patient Instructions (Addendum)
Resume Simvastatin 20 mg once daily.  Check labs with primary care physician in 6 weeks.   Follow up in 1 year.

## 2011-06-25 NOTE — Assessment & Plan Note (Signed)
This is being followed remotely as well as by Dr. Graciela Husbands.

## 2011-12-08 ENCOUNTER — Encounter: Payer: Self-pay | Admitting: Internal Medicine

## 2011-12-08 ENCOUNTER — Ambulatory Visit (INDEPENDENT_AMBULATORY_CARE_PROVIDER_SITE_OTHER): Payer: Medicare Other | Admitting: Internal Medicine

## 2011-12-08 VITALS — BP 117/72 | HR 88 | Resp 18 | Ht 67.0 in | Wt 157.0 lb

## 2011-12-08 DIAGNOSIS — I498 Other specified cardiac arrhythmias: Secondary | ICD-10-CM

## 2011-12-08 DIAGNOSIS — R001 Bradycardia, unspecified: Secondary | ICD-10-CM

## 2011-12-08 DIAGNOSIS — Z950 Presence of cardiac pacemaker: Secondary | ICD-10-CM

## 2011-12-08 LAB — PACEMAKER DEVICE OBSERVATION
AL AMPLITUDE: 4 mv
AL THRESHOLD: 0.75 V
BAMS-0001: 175 {beats}/min
RV LEAD AMPLITUDE: 31.36 mv

## 2011-12-08 NOTE — Assessment & Plan Note (Signed)
The patient's device was interrogated.  The information was reviewed. No changes were made in the programming.    

## 2011-12-08 NOTE — Progress Notes (Signed)
Patient Care Team: Tula Nakayama, MD as PCP - General (Family Medicine)   HPI  [name] is a 78 y.o. male Seen in followup for a pacemaker implanted September 2010 for symptomatic bradycardia.  He has a history of coronary artery disease with prior bypass surgery and PCI of the circumflex lesion September 2010. Because of symptoms of exertional shortness of breath and chest pain that was somewhat atypical he underwent a lexiscan stress testing August 2011 demonstrating normal left ventricular function and no ischemia   He continues to struggle with a headache. He has seen multiple physicians without significant relief   Past Medical History      Past Medical History  Diagnosis Date  . Sinus node dysfunction     s/p PPM  . Ventricular tachycardia, non-sustained   . HLD (hyperlipidemia)   . Pacemaker   . CAD (coronary artery disease)     s/p CABG in 2001; lexiscan 2011  no ischemia and nl  LV function    Past Surgical History  Procedure Date  . Bladder stone removal 2005  . Pacemaker insertion   . Coronary artery bypass graft 2001    LIMA to LAD, SVG to RCA, SVG to ramus, sequential SVG to  first and second diagonal.  . Insert / replace / remove pacemaker   . Cataract surgery     bilateral  . Cardiac catheterization 09/2008    patent grafts. 80% proximal stneosis in left circumflex artery (subsequent stress test showed no ischemia)    Current Outpatient Prescriptions  Medication Sig Dispense Refill  . aspirin 81 MG tablet Take 81 mg by mouth daily.        . flurbiprofen (ANSAID) 100 MG tablet Take 100 mg by mouth 2 (two) times daily as needed.      . gabapentin (NEURONTIN) 300 MG capsule Takes 2 tablet at bedtime daily.      . metoprolol tartrate (LOPRESSOR) 25 MG tablet Take 1 tablet (25 mg total) by mouth 2 (two) times daily.  90 tablet  0  . Multiple Vitamin (MULTIVITAMIN) tablet Take 1 tablet by mouth daily.        . nitroGLYCERIN (NITROSTAT) 0.4 MG SL  tablet Place 0.4 mg under the tongue every 5 (five) minutes as needed.        Marland Kitchen omeprazole (PRILOSEC) 20 MG capsule Take 20 mg by mouth daily.       . promethazine (PHENERGAN) 25 MG tablet Takes 1-2 tablets tid as needed.      . sertraline (ZOLOFT) 50 MG tablet Take 50 mg by mouth daily.      . simvastatin (ZOCOR) 20 MG tablet Take 1 tablet (20 mg total) by mouth at bedtime.  30 tablet  11  . traZODone (DESYREL) 150 MG tablet Take 150 mg by mouth at bedtime.      . vitamin B-12 (CYANOCOBALAMIN) 1000 MCG tablet Take 1,000 mcg by mouth daily.        No Known Allergies  Review of Systems negative except from HPI and PMH  Physical Exam BP 117/72  Pulse 88  Resp 18  Ht 5\' 7"  (1.702 m)  Wt 157 lb (71.215 kg)  BMI 24.59 kg/m2  SpO2 98%  s Well developed and nourished in no acute distress HENT normal; hearing aids in place Neck supple with JVP-flat Carotids brisk and full without bruits Clear Regular rate and rhythm, no murmurs or gallops Abd-soft with active BS without hepatomegaly No Clubbing cyanosis edema Skin-warm  and dry A & Oriented  Grossly normal sensory and motor function .skpe   Assessment and  Plan

## 2011-12-08 NOTE — Assessment & Plan Note (Signed)
Stable  Post pacing  100% Apacing

## 2011-12-08 NOTE — Patient Instructions (Addendum)
Remote monitoring is used to monitor your Pacemaker of ICD from home. This monitoring reduces the number of office visits required to check your device to one time per year. It allows [place] to keep an eye on the functioning of your device to ensure it is working properly. You are scheduled for a device check from home on 03/13/12. You may send your transmission at any time that day. If you have a wireless device, the transmission will be sent automatically. After your physician reviews your transmission, you will receive a postcard with your next transmission date.  Your physician wants you to follow-up in: 1 year with Dr. Graciela Husbands.   You will receive a reminder letter in the mail two months in advance. If you don't receive a letter, please call our office to schedule the follow-up appointment.

## 2012-03-13 ENCOUNTER — Encounter: Payer: Medicare Other | Admitting: *Deleted

## 2012-03-21 ENCOUNTER — Encounter: Payer: Self-pay | Admitting: *Deleted

## 2012-03-27 ENCOUNTER — Ambulatory Visit (INDEPENDENT_AMBULATORY_CARE_PROVIDER_SITE_OTHER): Payer: Medicare Other | Admitting: *Deleted

## 2012-03-27 ENCOUNTER — Encounter: Payer: Self-pay | Admitting: Internal Medicine

## 2012-03-27 ENCOUNTER — Other Ambulatory Visit: Payer: Self-pay

## 2012-03-27 DIAGNOSIS — I498 Other specified cardiac arrhythmias: Secondary | ICD-10-CM

## 2012-03-27 DIAGNOSIS — Z950 Presence of cardiac pacemaker: Secondary | ICD-10-CM

## 2012-03-27 DIAGNOSIS — R001 Bradycardia, unspecified: Secondary | ICD-10-CM

## 2012-04-05 LAB — REMOTE PACEMAKER DEVICE
ATRIAL PACING PM: 83
BAMS-0001: 175 {beats}/min
BATTERY VOLTAGE: 2.79 V
RV LEAD AMPLITUDE: 22.4 mv
RV LEAD IMPEDENCE PM: 792 Ohm
VENTRICULAR PACING PM: 0

## 2012-05-04 ENCOUNTER — Encounter: Payer: Self-pay | Admitting: *Deleted

## 2012-06-26 ENCOUNTER — Encounter: Payer: Self-pay | Admitting: Cardiovascular Disease

## 2012-06-26 ENCOUNTER — Ambulatory Visit (INDEPENDENT_AMBULATORY_CARE_PROVIDER_SITE_OTHER): Payer: Medicare Other | Admitting: Cardiovascular Disease

## 2012-06-26 VITALS — BP 102/68 | HR 67 | Ht 67.0 in | Wt 152.5 lb

## 2012-06-26 DIAGNOSIS — Z950 Presence of cardiac pacemaker: Secondary | ICD-10-CM

## 2012-06-26 DIAGNOSIS — R51 Headache: Secondary | ICD-10-CM

## 2012-06-26 DIAGNOSIS — R079 Chest pain, unspecified: Secondary | ICD-10-CM

## 2012-06-26 DIAGNOSIS — E785 Hyperlipidemia, unspecified: Secondary | ICD-10-CM

## 2012-06-26 DIAGNOSIS — I2510 Atherosclerotic heart disease of native coronary artery without angina pectoris: Secondary | ICD-10-CM

## 2012-06-26 NOTE — Assessment & Plan Note (Signed)
This is being monitored by Dr. Graciela Husbands.

## 2012-06-26 NOTE — Assessment & Plan Note (Signed)
He appears to be stable from a cardiac standpoint with no symptoms suggestive of angina, heart failure or arrhythmia. Continue medical therapy.

## 2012-06-26 NOTE — Assessment & Plan Note (Signed)
This is the biggest issue for the patient at this point. He underwent extensive workup in the past and has seen multiple neurologists. Both trazodone and Sertraline can cause headache. It might be worth switched these medications to something else. I asked him to followup with Dr. Hollice Espy and discuss this with him.

## 2012-06-26 NOTE — Assessment & Plan Note (Signed)
The patient stopped taking simvastatin due to concerns that it might be causing headache. However, it appears that stopping the medication did not have any effect on his headache frequency. I do think a statin can be resumed in the future. The patient is mainly preoccupied today with the issue of headache.

## 2012-06-26 NOTE — Progress Notes (Signed)
HPI  [name] is an 79 year old gentleman who is here today for an annual followup visit. Overall he has been stable from a cardiac standpoint with no reported symptoms of chest pain, dyspnea, palpitations or dizziness. He has known history of coronary artery disease status post CABG. Most recent cardiac catheterization in 2010 showed patent grafts with 80% ostial left circumflex stenosis. Subsequent nuclear stress test in 2011 showed no evidence of ischemia with normal ejection fraction. He is also status post dual-chamber pacemaker placement monitored by Dr. Graciela Husbands. He continues to suffer from daily headaches and has seen multiple neurologists for this. He reports previous deer tick bite in 2011 followed by a rash. His headache developed after that. He reports being tested for Lyme disease was negative workup. He has been extremely frustrated with these daily headaches and has cut down his activities significantly because of this.    No Known Allergies   Current Outpatient Prescriptions on File Prior to Visit  Medication Sig Dispense Refill  . aspirin 81 MG tablet Take 81 mg by mouth daily.        . flurbiprofen (ANSAID) 100 MG tablet Take 100 mg by mouth 2 (two) times daily as needed.      . metoprolol tartrate (LOPRESSOR) 25 MG tablet Take 1 tablet (25 mg total) by mouth 2 (two) times daily.  90 tablet  0  . Multiple Vitamin (MULTIVITAMIN) tablet Take 1 tablet by mouth daily.        . nitroGLYCERIN (NITROSTAT) 0.4 MG SL tablet Place 0.4 mg under the tongue every 5 (five) minutes as needed.        Marland Kitchen omeprazole (PRILOSEC) 20 MG capsule Take 20 mg by mouth daily.       . promethazine (PHENERGAN) 25 MG tablet Takes 1-2 tablets tid as needed.      . sertraline (ZOLOFT) 50 MG tablet Take 50 mg by mouth daily.      . vitamin B-12 (CYANOCOBALAMIN) 1000 MCG tablet Take 1,000 mcg by mouth daily.       No current facility-administered medications on file prior to visit.     Past Medical  History  Diagnosis Date  . Sinus node dysfunction     s/p PPM  . Ventricular tachycardia, non-sustained   . HLD (hyperlipidemia)   . Pacemaker   . CAD (coronary artery disease)     s/p CABG in 2001; lexiscan 2011  no ischemia and nl  LV function     Past Surgical History  Procedure Laterality Date  . Bladder stone removal  2005  . Pacemaker insertion    . Coronary artery bypass graft  2001    LIMA to LAD, SVG to RCA, SVG to ramus, sequential SVG to  first and second diagonal.  . Insert / replace / remove pacemaker    . Cataract surgery      bilateral  . Cardiac catheterization  09/2008    patent grafts. 80% proximal stneosis in left circumflex artery (subsequent stress test showed no ischemia)     Family History  Problem Relation Age of Onset  . Heart attack       History   Social History  . Marital Status: Married    Spouse Name: N/A    Number of Children: 1  . Years of Education: N/A   Occupational History  . retired    Social History Main Topics  . Smoking status: Never Smoker   . Smokeless tobacco: Not on file  .  Alcohol Use: No  . Drug Use: No  . Sexually Active: Not on file   Other Topics Concern  . Not on file   Social History Narrative  . No narrative on file     PHYSICAL EXAM   BP 102/68  Pulse 67  Ht 5\' 7"  (1.702 m)  Wt 152 lb 8 oz (69.174 kg)  BMI 23.88 kg/m2 Constitutional: He is oriented to person, place, and time. He appears well-developed and well-nourished. No distress.  HENT: No nasal discharge.  Head: Normocephalic and atraumatic.  Eyes: Pupils are equal and round. Right eye exhibits no discharge. Left eye exhibits no discharge.  Neck: Normal range of motion. Neck supple. No JVD present. No thyromegaly present.  Cardiovascular: Normal rate, regular rhythm, normal heart sounds and. Exam reveals no gallop and no friction rub. No murmur heard.  Pulmonary/Chest: Effort normal and breath sounds normal. No stridor. No respiratory  distress. He has no wheezes. He has no rales. He exhibits no tenderness.  Abdominal: Soft. Bowel sounds are normal. He exhibits no distension. There is no tenderness. There is no rebound and no guarding.  Musculoskeletal: Normal range of motion. He exhibits no edema and no tenderness.  Neurological: He is alert and oriented to person, place, and time. Coordination normal.  Skin: Skin is warm and dry. No rash noted. He is not diaphoretic. No erythema. No pallor.  Psychiatric: He has a normal mood and affect. His behavior is normal. Judgment and thought content normal.       EKG: Normal sinus rhythm   ASSESSMENT AND PLAN

## 2012-06-26 NOTE — Patient Instructions (Addendum)
Follow up in 1 year.

## 2012-07-03 ENCOUNTER — Ambulatory Visit (INDEPENDENT_AMBULATORY_CARE_PROVIDER_SITE_OTHER): Payer: Medicare Other | Admitting: *Deleted

## 2012-07-03 DIAGNOSIS — R001 Bradycardia, unspecified: Secondary | ICD-10-CM

## 2012-07-03 DIAGNOSIS — Z950 Presence of cardiac pacemaker: Secondary | ICD-10-CM

## 2012-07-03 DIAGNOSIS — I498 Other specified cardiac arrhythmias: Secondary | ICD-10-CM

## 2012-07-04 LAB — REMOTE PACEMAKER DEVICE
AL THRESHOLD: 0.75 V
BAMS-0001: 175 {beats}/min
RV LEAD AMPLITUDE: 22.4 mv
RV LEAD IMPEDENCE PM: 834 Ohm
RV LEAD THRESHOLD: 1 V

## 2012-07-12 ENCOUNTER — Encounter: Payer: Self-pay | Admitting: *Deleted

## 2012-08-18 ENCOUNTER — Encounter: Payer: Self-pay | Admitting: Internal Medicine

## 2012-10-09 ENCOUNTER — Encounter: Payer: Medicare Other | Admitting: *Deleted

## 2012-10-16 ENCOUNTER — Ambulatory Visit (INDEPENDENT_AMBULATORY_CARE_PROVIDER_SITE_OTHER): Payer: Medicare Other | Admitting: *Deleted

## 2012-10-16 DIAGNOSIS — Z950 Presence of cardiac pacemaker: Secondary | ICD-10-CM

## 2012-10-16 DIAGNOSIS — R001 Bradycardia, unspecified: Secondary | ICD-10-CM

## 2012-10-16 DIAGNOSIS — I498 Other specified cardiac arrhythmias: Secondary | ICD-10-CM

## 2012-10-30 LAB — REMOTE PACEMAKER DEVICE
AL IMPEDENCE PM: 489 Ohm
ATRIAL PACING PM: 84
BAMS-0001: 175 {beats}/min
BATTERY VOLTAGE: 2.79 V
RV LEAD AMPLITUDE: 22.4 mv
RV LEAD IMPEDENCE PM: 714 Ohm
VENTRICULAR PACING PM: 0

## 2012-11-10 ENCOUNTER — Encounter: Payer: Self-pay | Admitting: *Deleted

## 2012-11-21 ENCOUNTER — Encounter: Payer: Self-pay | Admitting: Internal Medicine

## 2012-12-07 ENCOUNTER — Ambulatory Visit (INDEPENDENT_AMBULATORY_CARE_PROVIDER_SITE_OTHER): Payer: Medicare Other | Admitting: Internal Medicine

## 2012-12-07 ENCOUNTER — Encounter: Payer: Self-pay | Admitting: Internal Medicine

## 2012-12-07 VITALS — BP 144/85 | HR 87 | Ht 67.0 in | Wt 159.8 lb

## 2012-12-07 DIAGNOSIS — I2510 Atherosclerotic heart disease of native coronary artery without angina pectoris: Secondary | ICD-10-CM

## 2012-12-07 DIAGNOSIS — I498 Other specified cardiac arrhythmias: Secondary | ICD-10-CM

## 2012-12-07 DIAGNOSIS — R001 Bradycardia, unspecified: Secondary | ICD-10-CM

## 2012-12-07 DIAGNOSIS — R51 Headache: Secondary | ICD-10-CM

## 2012-12-07 DIAGNOSIS — I495 Sick sinus syndrome: Secondary | ICD-10-CM

## 2012-12-07 DIAGNOSIS — Z950 Presence of cardiac pacemaker: Secondary | ICD-10-CM

## 2012-12-07 LAB — MDC_IDC_ENUM_SESS_TYPE_INCLINIC
Battery Impedance: 227 Ohm
Brady Statistic AP VP Percent: 0 %
Brady Statistic AP VS Percent: 84 %
Brady Statistic AS VS Percent: 16 %
Date Time Interrogation Session: 20141204153131
Lead Channel Impedance Value: 482 Ohm
Lead Channel Impedance Value: 704 Ohm
Lead Channel Pacing Threshold Pulse Width: 0.4 ms
Lead Channel Pacing Threshold Pulse Width: 0.4 ms
Lead Channel Sensing Intrinsic Amplitude: 22.4 mV
Lead Channel Setting Sensing Sensitivity: 5.6 mV

## 2012-12-07 NOTE — Assessment & Plan Note (Signed)
100% atrial pacing with good heart rate excursion

## 2012-12-07 NOTE — Progress Notes (Signed)
Patient Care Team: Barron Alvine as PCP - General (Family Medicine)   HPI  [name] is a 79 y.o. male Seen in followup for a pacemaker implanted September 2010 for symptomatic bradycardia.  He has a history of coronary artery disease with prior bypass surgery and PCI of the circumflex lesion September 2010. Because of symptoms of exertional shortness of breath and chest pain that was somewhat atypical he underwent a lexiscan stress testing August 2011 demonstrating normal left ventricular function and no ischemia  He continues to struggle with a headache. He has seen multiple physicians without significant relief  unforunataley the saga goes on   Past Medical History  Diagnosis Date  . Sinus node dysfunction     s/p PPM  . Ventricular tachycardia, non-sustained   . HLD (hyperlipidemia)   . Pacemaker   . CAD (coronary artery disease)     s/p CABG in 2001; lexiscan 2011  no ischemia and nl  LV function    Past Surgical History  Procedure Laterality Date  . Bladder stone removal  2005  . Pacemaker insertion    . Coronary artery bypass graft  2001    LIMA to LAD, SVG to RCA, SVG to ramus, sequential SVG to  first and second diagonal.  . Insert / replace / remove pacemaker    . Cataract surgery      bilateral  . Cardiac catheterization  09/2008    patent grafts. 80% proximal stneosis in left circumflex artery (subsequent stress test showed no ischemia)    Current Outpatient Prescriptions  Medication Sig Dispense Refill  . aspirin 81 MG tablet Take 81 mg by mouth daily.        . flurbiprofen (ANSAID) 100 MG tablet Take 100 mg by mouth 2 (two) times daily as needed.      . gabapentin (NEURONTIN) 600 MG tablet Take 600 mg by mouth daily.      . metoprolol tartrate (LOPRESSOR) 25 MG tablet Take 1 tablet (25 mg total) by mouth 2 (two) times daily.  90 tablet  0  . Multiple Vitamin (MULTIVITAMIN) tablet Take 1 tablet by mouth daily.        . nitroGLYCERIN (NITROSTAT) 0.4  MG SL tablet Place 0.4 mg under the tongue every 5 (five) minutes as needed.        Marland Kitchen omeprazole (PRILOSEC) 20 MG capsule Take 20 mg by mouth daily.       . promethazine (PHENERGAN) 25 MG tablet Takes 1-2 tablets tid as needed.      . sertraline (ZOLOFT) 50 MG tablet Take 50 mg by mouth daily.      . traZODone (DESYREL) 150 MG tablet Take 150 mg by mouth at bedtime.       . vitamin B-12 (CYANOCOBALAMIN) 1000 MCG tablet Take 1,000 mcg by mouth daily.       No current facility-administered medications for this visit.    No Known Allergies  Review of Systems negative except from HPI and PMH  Physical Exam BP 144/85  Pulse 87  Ht 5\' 7"  (1.702 m)  Wt 159 lb 12.8 oz (72.485 kg)  BMI 25.02 kg/m2 Well developed and nourished in no acute distress HENT normal Neck supple with JVP-flat Carotids brisk and full without bruits Clear Device pocket well healed; without hematoma or erythema.  There is no tethering  Regular rate and rhythm, no murmurs or gallops Abd-soft with active BS without hepatomegaly No Clubbing cyanosis edema Skin-warm and dry  A & Oriented  Grossly normal sensory and motor function     Assessment and  Plan

## 2012-12-07 NOTE — Assessment & Plan Note (Signed)
The patient's device was interrogated.  The information was reviewed. No changes were made in the programming.    

## 2012-12-07 NOTE — Patient Instructions (Signed)
Your physician recommends that you continue on your current medications as directed. Please refer to the Current Medication list given to you today.  Remote monitoring is used to monitor your Pacemaker of ICD from home. This monitoring reduces the number of office visits required to check your device to one time per year. It allows [place] to keep an eye on the functioning of your device to ensure it is working properly. You are scheduled for a device check from home on 03/12/2013. You may send your transmission at any time that day. If you have a wireless device, the transmission will be sent automatically. After your physician reviews your transmission, you will receive a postcard with your next transmission date.  Your physician wants you to follow-up in: one year with Dr. Graciela Husbands.  You will receive a reminder letter in the mail two months in advance. If you don't receive a letter, please call our office to schedule the follow-up appointment.

## 2012-12-07 NOTE — Assessment & Plan Note (Signed)
Stable we'll continue him on his current medications

## 2013-03-12 ENCOUNTER — Ambulatory Visit (INDEPENDENT_AMBULATORY_CARE_PROVIDER_SITE_OTHER): Payer: Medicare Other | Admitting: *Deleted

## 2013-03-12 DIAGNOSIS — Z950 Presence of cardiac pacemaker: Secondary | ICD-10-CM

## 2013-03-12 DIAGNOSIS — R001 Bradycardia, unspecified: Secondary | ICD-10-CM

## 2013-03-12 DIAGNOSIS — I498 Other specified cardiac arrhythmias: Secondary | ICD-10-CM

## 2013-03-12 LAB — MDC_IDC_ENUM_SESS_TYPE_REMOTE
Battery Impedance: 227 Ohm
Battery Remaining Longevity: 118 mo
Brady Statistic AP VP Percent: 0 %
Date Time Interrogation Session: 20150309153801
Lead Channel Impedance Value: 503 Ohm
Lead Channel Setting Pacing Amplitude: 2 V
Lead Channel Setting Pacing Amplitude: 2.5 V
Lead Channel Setting Sensing Sensitivity: 5.6 mV
MDC IDC MSMT BATTERY VOLTAGE: 2.79 V
MDC IDC MSMT LEADCHNL RA PACING THRESHOLD AMPLITUDE: 0.75 V
MDC IDC MSMT LEADCHNL RA PACING THRESHOLD PULSEWIDTH: 0.4 ms
MDC IDC MSMT LEADCHNL RV IMPEDANCE VALUE: 781 Ohm
MDC IDC MSMT LEADCHNL RV PACING THRESHOLD AMPLITUDE: 1.125 V
MDC IDC MSMT LEADCHNL RV PACING THRESHOLD PULSEWIDTH: 0.4 ms
MDC IDC MSMT LEADCHNL RV SENSING INTR AMPL: 16 mV
MDC IDC SET LEADCHNL RV PACING PULSEWIDTH: 0.4 ms
MDC IDC STAT BRADY AP VS PERCENT: 81 %
MDC IDC STAT BRADY AS VP PERCENT: 0 %
MDC IDC STAT BRADY AS VS PERCENT: 19 %

## 2013-04-04 ENCOUNTER — Encounter: Payer: Self-pay | Admitting: *Deleted

## 2013-04-12 ENCOUNTER — Encounter: Payer: Self-pay | Admitting: Internal Medicine

## 2013-06-13 ENCOUNTER — Telehealth: Payer: Self-pay | Admitting: Cardiology

## 2013-06-13 ENCOUNTER — Ambulatory Visit (INDEPENDENT_AMBULATORY_CARE_PROVIDER_SITE_OTHER): Payer: Medicare Other | Admitting: *Deleted

## 2013-06-13 DIAGNOSIS — I498 Other specified cardiac arrhythmias: Secondary | ICD-10-CM

## 2013-06-13 DIAGNOSIS — R001 Bradycardia, unspecified: Secondary | ICD-10-CM

## 2013-06-13 LAB — MDC_IDC_ENUM_SESS_TYPE_REMOTE
Battery Remaining Longevity: 117 mo
Battery Voltage: 2.79 V
Brady Statistic AS VP Percent: 0 %
Lead Channel Impedance Value: 489 Ohm
Lead Channel Impedance Value: 716 Ohm
Lead Channel Pacing Threshold Amplitude: 0.75 V
Lead Channel Pacing Threshold Amplitude: 1.375 V
Lead Channel Setting Pacing Amplitude: 2 V
Lead Channel Setting Pacing Pulse Width: 0.4 ms
Lead Channel Setting Sensing Sensitivity: 5.6 mV
MDC IDC MSMT BATTERY IMPEDANCE: 275 Ohm
MDC IDC MSMT LEADCHNL RA PACING THRESHOLD PULSEWIDTH: 0.4 ms
MDC IDC MSMT LEADCHNL RV PACING THRESHOLD PULSEWIDTH: 0.4 ms
MDC IDC MSMT LEADCHNL RV SENSING INTR AMPL: 16 mV
MDC IDC SESS DTM: 20150610163506
MDC IDC SET LEADCHNL RV PACING AMPLITUDE: 2.75 V
MDC IDC STAT BRADY AP VP PERCENT: 0 %
MDC IDC STAT BRADY AP VS PERCENT: 82 %
MDC IDC STAT BRADY AS VS PERCENT: 18 %

## 2013-06-13 NOTE — Progress Notes (Signed)
Remote pacemaker transmission.   

## 2013-06-13 NOTE — Telephone Encounter (Signed)
Spoke with pt and reminded pt of remote transmission that is due today. Pt verbalized understanding.   

## 2013-06-22 ENCOUNTER — Encounter: Payer: Self-pay | Admitting: Cardiovascular Disease

## 2013-06-22 ENCOUNTER — Ambulatory Visit (INDEPENDENT_AMBULATORY_CARE_PROVIDER_SITE_OTHER): Payer: Medicare Other | Admitting: Cardiovascular Disease

## 2013-06-22 VITALS — BP 98/66 | HR 69 | Ht 67.0 in | Wt 141.8 lb

## 2013-06-22 DIAGNOSIS — Z950 Presence of cardiac pacemaker: Secondary | ICD-10-CM

## 2013-06-22 DIAGNOSIS — I498 Other specified cardiac arrhythmias: Secondary | ICD-10-CM

## 2013-06-22 DIAGNOSIS — R001 Bradycardia, unspecified: Secondary | ICD-10-CM

## 2013-06-22 DIAGNOSIS — E7800 Pure hypercholesterolemia, unspecified: Secondary | ICD-10-CM

## 2013-06-22 DIAGNOSIS — E785 Hyperlipidemia, unspecified: Secondary | ICD-10-CM

## 2013-06-22 MED ORDER — ATORVASTATIN CALCIUM 20 MG PO TABS: 20.0000 mg | ORAL_TABLET | Freq: Every day | ORAL | Status: DC

## 2013-06-22 NOTE — Assessment & Plan Note (Signed)
He is doing well overall with no symptoms suggestive of angina. Continue medical therapy. 

## 2013-06-22 NOTE — Progress Notes (Signed)
HPI  [name] is an 80 year old gentleman who is here today for an annual followup visit regarding coronary artery disease. Overall he has been stable from a cardiac standpoint with no reported symptoms of chest pain, dyspnea, palpitations or dizziness. He has known history of coronary artery disease status post CABG. Most recent cardiac catheterization in 2010 showed patent grafts with 80% ostial left circumflex stenosis. Subsequent nuclear stress test in 2011 showed no evidence of ischemia with normal ejection fraction. He is also status post dual-chamber pacemaker placement monitored by Dr. Graciela HusbandsKlein. Headache is improved from before. He has been more active with no exertional symptoms.    No Known Allergies   Current Outpatient Prescriptions on File Prior to Visit  Medication Sig Dispense Refill  . aspirin 81 MG tablet Take 81 mg by mouth daily.        Marland Kitchen. gabapentin (NEURONTIN) 600 MG tablet Take 600 mg by mouth daily.      . metoprolol tartrate (LOPRESSOR) 25 MG tablet Take 1 tablet (25 mg total) by mouth 2 (two) times daily.  90 tablet  0  . Multiple Vitamin (MULTIVITAMIN) tablet Take 1 tablet by mouth daily.        . nitroGLYCERIN (NITROSTAT) 0.4 MG SL tablet Place 0.4 mg under the tongue every 5 (five) minutes as needed.        Marland Kitchen. omeprazole (PRILOSEC) 20 MG capsule Take 20 mg by mouth daily.       . sertraline (ZOLOFT) 50 MG tablet Take 50 mg by mouth daily.      . traZODone (DESYREL) 150 MG tablet Take 200 mg by mouth at bedtime.       . vitamin B-12 (CYANOCOBALAMIN) 1000 MCG tablet Take 1,000 mcg by mouth daily.       No current facility-administered medications on file prior to visit.     Past Medical History  Diagnosis Date  . Sinus node dysfunction     s/p PPM  . Ventricular tachycardia, non-sustained   . HLD (hyperlipidemia)   . Pacemaker   . CAD (coronary artery disease)     s/p CABG in 2001; lexiscan 2011  no ischemia and nl  LV function     Past Surgical  History  Procedure Laterality Date  . Bladder stone removal  2005  . Pacemaker insertion    . Coronary artery bypass graft  2001    LIMA to LAD, SVG to RCA, SVG to ramus, sequential SVG to  first and second diagonal.  . Insert / replace / remove pacemaker    . Cataract surgery      bilateral  . Cardiac catheterization  09/2008    patent grafts. 80% proximal stneosis in left circumflex artery (subsequent stress test showed no ischemia)     Family History  Problem Relation Age of Onset  . Heart attack       History   Social History  . Marital Status: Married    Spouse Name: N/A    Number of Children: 1  . Years of Education: N/A   Occupational History  . retired    Social History Main Topics  . Smoking status: Never Smoker   . Smokeless tobacco: Not on file  . Alcohol Use: No  . Drug Use: No  . Sexual Activity: Not on file   Other Topics Concern  . Not on file   Social History Narrative  . No narrative on file     PHYSICAL EXAM  BP 98/66  Pulse 69  Ht 5\' 7"  (1.702 m)  Wt 141 lb 12.8 oz (64.32 kg)  BMI 22.20 kg/m2 Constitutional: He is oriented to person, place, and time. He appears well-developed and well-nourished. No distress.  HENT: No nasal discharge.  Head: Normocephalic and atraumatic.  Eyes: Pupils are equal and round. Right eye exhibits no discharge. Left eye exhibits no discharge.  Neck: Normal range of motion. Neck supple. No JVD present. No thyromegaly present.  Cardiovascular: Normal rate, regular rhythm, normal heart sounds and. Exam reveals no gallop and no friction rub. No murmur heard.  Pulmonary/Chest: Effort normal and breath sounds normal. No stridor. No respiratory distress. He has no wheezes. He has no rales. He exhibits no tenderness.  Abdominal: Soft. Bowel sounds are normal. He exhibits no distension. There is no tenderness. There is no rebound and no guarding.  Musculoskeletal: Normal range of motion. He exhibits no edema and no  tenderness.  Neurological: He is alert and oriented to person, place, and time. Coordination normal.  Skin: Skin is warm and dry. No rash noted. He is not diaphoretic. No erythema. No pallor.  Psychiatric: He has a normal mood and affect. His behavior is normal. Judgment and thought content normal.       EKG: Normal sinus rhythm -First degree A-V block  BORDERLINE RHYTHM   ASSESSMENT AND PLAN

## 2013-06-22 NOTE — Assessment & Plan Note (Signed)
This is being followed by Dr. Klein. 

## 2013-06-22 NOTE — Patient Instructions (Signed)
Your physician has recommended you make the following change in your medication:  Start Atorvastatin 20 mg daily   Your physician recommends that you return for lab work in:  Please take lab orders to PCP for labs in 6 weeks    Your physician wants you to follow-up in: 1 year. You will receive a reminder letter in the mail two months in advance. If you don't receive a letter, please call our office to schedule the follow-up appointment.

## 2013-06-22 NOTE — Assessment & Plan Note (Signed)
Most recent lipid profile showed an LDL of 102. Although his LDL is not significantly high, I do believe he would benefit from being on a statin given his established history of coronary artery disease post CABG. Thus, I started him on atorvastatin 20 mg once daily. Check fasting lipid and liver profile in 6 weeks.

## 2013-07-10 ENCOUNTER — Encounter: Payer: Self-pay | Admitting: Cardiology

## 2013-07-17 ENCOUNTER — Encounter: Payer: Self-pay | Admitting: Internal Medicine

## 2013-09-17 ENCOUNTER — Telehealth: Payer: Self-pay | Admitting: Cardiology

## 2013-09-17 ENCOUNTER — Ambulatory Visit (INDEPENDENT_AMBULATORY_CARE_PROVIDER_SITE_OTHER): Payer: Medicare Other | Admitting: *Deleted

## 2013-09-17 DIAGNOSIS — R001 Bradycardia, unspecified: Secondary | ICD-10-CM

## 2013-09-17 DIAGNOSIS — I498 Other specified cardiac arrhythmias: Secondary | ICD-10-CM

## 2013-09-17 LAB — MDC_IDC_ENUM_SESS_TYPE_REMOTE
Battery Impedance: 299 Ohm
Battery Remaining Longevity: 114 mo
Battery Voltage: 2.79 V
Brady Statistic AP VS Percent: 83 %
Brady Statistic AS VS Percent: 17 %
Date Time Interrogation Session: 20150914192240
Lead Channel Impedance Value: 743 Ohm
Lead Channel Pacing Threshold Amplitude: 1.5 V
Lead Channel Pacing Threshold Pulse Width: 0.4 ms
Lead Channel Pacing Threshold Pulse Width: 0.4 ms
Lead Channel Sensing Intrinsic Amplitude: 16 mV
Lead Channel Setting Pacing Amplitude: 3 V
Lead Channel Setting Pacing Pulse Width: 0.4 ms
Lead Channel Setting Sensing Sensitivity: 5.6 mV
MDC IDC MSMT LEADCHNL RA IMPEDANCE VALUE: 489 Ohm
MDC IDC MSMT LEADCHNL RA PACING THRESHOLD AMPLITUDE: 0.75 V
MDC IDC SET LEADCHNL RA PACING AMPLITUDE: 2 V
MDC IDC STAT BRADY AP VP PERCENT: 0 %
MDC IDC STAT BRADY AS VP PERCENT: 0 %

## 2013-09-17 NOTE — Telephone Encounter (Signed)
LMOVM reminding pt to send remote transmission.   

## 2013-09-17 NOTE — Progress Notes (Signed)
Remote pacemaker transmission.   

## 2013-10-08 ENCOUNTER — Encounter: Payer: Self-pay | Admitting: Cardiology

## 2013-10-12 ENCOUNTER — Encounter: Payer: Self-pay | Admitting: Internal Medicine

## 2013-12-11 ENCOUNTER — Encounter: Payer: Self-pay | Admitting: Internal Medicine

## 2013-12-11 ENCOUNTER — Ambulatory Visit (INDEPENDENT_AMBULATORY_CARE_PROVIDER_SITE_OTHER): Payer: Medicare Other | Admitting: Internal Medicine

## 2013-12-11 VITALS — BP 126/74 | HR 68 | Ht 67.0 in | Wt 146.8 lb

## 2013-12-11 DIAGNOSIS — R001 Bradycardia, unspecified: Secondary | ICD-10-CM

## 2013-12-11 DIAGNOSIS — Z45018 Encounter for adjustment and management of other part of cardiac pacemaker: Secondary | ICD-10-CM

## 2013-12-11 LAB — MDC_IDC_ENUM_SESS_TYPE_INCLINIC
Battery Impedance: 299 Ohm
Battery Remaining Longevity: 114 mo
Battery Voltage: 2.79 V
Brady Statistic AP VP Percent: 0 %
Brady Statistic AS VS Percent: 16 %
Lead Channel Impedance Value: 489 Ohm
Lead Channel Impedance Value: 749 Ohm
Lead Channel Pacing Threshold Amplitude: 0.75 V
Lead Channel Pacing Threshold Pulse Width: 0.4 ms
Lead Channel Sensing Intrinsic Amplitude: 22.4 mV
Lead Channel Sensing Intrinsic Amplitude: 4 mV
Lead Channel Setting Pacing Amplitude: 2 V
Lead Channel Setting Pacing Amplitude: 2.5 V
Lead Channel Setting Pacing Pulse Width: 0.4 ms
Lead Channel Setting Sensing Sensitivity: 5.6 mV
MDC IDC MSMT LEADCHNL RA PACING THRESHOLD PULSEWIDTH: 0.4 ms
MDC IDC MSMT LEADCHNL RV PACING THRESHOLD AMPLITUDE: 1 V
MDC IDC SESS DTM: 20151208213121
MDC IDC STAT BRADY AP VS PERCENT: 84 %
MDC IDC STAT BRADY AS VP PERCENT: 0 %

## 2013-12-11 MED ORDER — RANOLAZINE ER 500 MG PO TB12: 500.0000 mg | ORAL_TABLET | Freq: Two times a day (BID) | ORAL | Status: DC

## 2013-12-11 NOTE — Patient Instructions (Signed)
Your physician has recommended you make the following change in your medication:  1) START Ranexa 500 mg twice daily  Remote monitoring is used to monitor your Pacemaker of ICD from home. This monitoring reduces the number of office visits required to check your device to one time per year. It allows us to keep an eye on the functioning of your device to ensure it is working properly. You are scheduled for a device check from home on 03/12/14. You may send your transmission at any time that day. If you have a wireless device, the transmission will be sent automatically. After your physician reviews your transmission, you will receive a postcard with your next transmission date.  Your physician wants you to follow-up in: 1 year with Dr. Graciela HusbandsKlein.  You will receive a reminder letter in the mail two months in advance. If you don't receive a letter, please call our office to schedule the follow-up appointment.'

## 2013-12-11 NOTE — Progress Notes (Signed)
Patient Care Team: Barron Alvineavid Gibson, MD as PCP - General (Family Medicine)   HPI  [name] is a 80 y.o. male Seen in followup for a pacemaker implanted September 2010 for symptomatic bradycardia.  He has a history of coronary artery disease with prior bypass surgery and PCI of the circumflex lesion September 2010. Because of symptoms of exertional shortness of breath and chest pain that was somewhat atypical he underwent a lexiscan stress testing August 2011 demonstrating normal left ventricular function and no ischemia   He continues to have chest pain  Which is typical by description with radiation to the neck triggered by exertion and relieved by rest. He can last minutes -fractions of hours at a time.   He continues to suffer with chronic headaches which he dates back [place]     Past Medical History  Diagnosis Date  . Sinus node dysfunction     s/p PPM  . Ventricular tachycardia, non-sustained   . HLD (hyperlipidemia)   . Pacemaker   . CAD (coronary artery disease)     s/p CABG in 2001; lexiscan 2011  no ischemia and nl  LV function    Past Surgical History  Procedure Laterality Date  . Bladder stone removal  2005  . Pacemaker insertion    . Coronary artery bypass graft  2001    LIMA to LAD, SVG to RCA, SVG to ramus, sequential SVG to  first and second diagonal.  . Insert / replace / remove pacemaker    . Cataract surgery      bilateral  . Cardiac catheterization  09/2008    patent grafts. 80% proximal stneosis in left circumflex artery (subsequent stress test showed no ischemia)    Current Outpatient Prescriptions  Medication Sig Dispense Refill  . aspirin 81 MG tablet Take 81 mg by mouth daily.      Marland Kitchen. gabapentin (NEURONTIN) 600 MG tablet Take 600 mg by mouth daily.    . metoprolol tartrate (LOPRESSOR) 25 MG tablet Take 1 tablet (25 mg total) by mouth 2 (two) times daily. 90 tablet 0  . Multiple Vitamin (MULTIVITAMIN) tablet Take 1 tablet by mouth  daily.      . nitroGLYCERIN (NITROSTAT) 0.4 MG SL tablet Place 0.4 mg under the tongue every 5 (five) minutes as needed.      Marland Kitchen. omeprazole (PRILOSEC) 20 MG capsule Take 20 mg by mouth daily.     . sertraline (ZOLOFT) 50 MG tablet Take 50 mg by mouth daily.    . traZODone (DESYREL) 100 MG tablet Take 200 mg by mouth daily.  0  . vitamin B-12 (CYANOCOBALAMIN) 1000 MCG tablet Take 1,000 mcg by mouth daily.     No current facility-administered medications for this visit.    No Known Allergies  Review of Systems negative except from HPI and PMH  Physical Exam BP 126/74 mmHg  Pulse 68  Ht 5\' 7"  (1.702 m)  Wt 146 lb 12.8 oz (66.588 kg)  BMI 22.99 kg/m2 Well developed and nourished in no acute distress HENT normal Neck supple with JVP-flat Carotids brisk and full without bruits Clear Device pocket well healed; without hematoma or erythema.  There is no tethering  Regular rate and rhythm, no murmurs or gallops Abd-soft with active BS without hepatomegaly No Clubbing cyanosis edema Skin-warm and dry A & Oriented  Grossly normal sensory and motor function     Assessment and  Plan   ischemic heart disease  With prior  bypass surgery   Sinus bradycardia   Implantable pacemaker-[employer] The patient's device was interrogated.  The information was reviewed. No changes were made in the programming.     Chest pain   headaches -chronic   From an arrhythmia point of view the patient is quite stable.  However, he continues to have progressive issues with exertional chest discomfort today lasted for [place] a protracted period of time [place]. As he had problems with his prior noninvasive study with his severe headaches temporally related to the last Myoview scan, I will talk with Dr. Marcheta GrammesMA about pursuing it directly with catheterization. He apparently had known disease for which PCI have been discussed at his last catheterization.   I will put him on Ranexa. I will  avoid nitroglycerin because of his headaches   I have spoken with his primary cardiologist about the concerns ; he will sit down with him I discussed catheterization

## 2013-12-13 ENCOUNTER — Encounter: Payer: Self-pay | Admitting: Cardiovascular Disease

## 2013-12-13 ENCOUNTER — Telehealth: Payer: Self-pay | Admitting: *Deleted

## 2013-12-13 ENCOUNTER — Ambulatory Visit (INDEPENDENT_AMBULATORY_CARE_PROVIDER_SITE_OTHER): Payer: Medicare Other | Admitting: Cardiovascular Disease

## 2013-12-13 VITALS — BP 98/64 | HR 73 | Ht 67.0 in | Wt 146.0 lb

## 2013-12-13 DIAGNOSIS — R079 Chest pain, unspecified: Secondary | ICD-10-CM

## 2013-12-13 DIAGNOSIS — E785 Hyperlipidemia, unspecified: Secondary | ICD-10-CM

## 2013-12-13 DIAGNOSIS — I25118 Atherosclerotic heart disease of native coronary artery with other forms of angina pectoris: Secondary | ICD-10-CM

## 2013-12-13 NOTE — Telephone Encounter (Signed)
Ok

## 2013-12-13 NOTE — Patient Instructions (Signed)
Continue same medications. Follow up in 1 month.  

## 2013-12-13 NOTE — Assessment & Plan Note (Addendum)
I started him on atorvastatin during last visit. However, I don't see in his medication list today and this was not discussed during the visit. We will call the patient and verify if he is taking it or not.

## 2013-12-13 NOTE — Assessment & Plan Note (Signed)
The patient had a recent episode of prolonged chest pain while he was rushing to his appointment. Otherwise, he reports that his exertional chest pain has not changed from before.  Given that the episode of chest pain he had lasted more than one hour, I recommended proceeding with cardiac catheterization for further evaluation. However, the patient prefers to try medical therapy first before invasive procedures. He was recently started on Ranexa and has used 2 doses so far. I will bring him back in one month for reevaluation.

## 2013-12-13 NOTE — Telephone Encounter (Signed)
Error

## 2013-12-13 NOTE — Telephone Encounter (Signed)
Patient stated that Atorvastatin made him feel so bad he just stopped taking it on his own

## 2013-12-13 NOTE — Telephone Encounter (Signed)
LVM to ask patient if he is on Atorvastatin

## 2013-12-13 NOTE — Telephone Encounter (Signed)
-----   Message from [place] OuchMuhammad A Arida, MD sent at 12/13/2013  2:56 PM EST ----- He is supposed to be on Atorvastatin but I didn't see that on his list. Can you call him and see if he is taking it or not?

## 2013-12-13 NOTE — Progress Notes (Signed)
HPI  [name] is an 80 year old gentleman who is here today for an annual followup visit regarding coronary artery disease. Overall he has been stable from a cardiac standpoint with no reported symptoms of chest pain, dyspnea, palpitations or dizziness. He has known history of coronary artery disease status post CABG in 2001. Most recent cardiac catheterization in 2010 showed patent grafts with 80% ostial left circumflex stenosis. Subsequent nuclear stress test in 2011 showed no evidence of ischemia with normal ejection fraction. He is also status post dual-chamber pacemaker placement monitored by Dr. Graciela HusbandsKlein. Headache is improved from before.  He was seen recently by Dr. Graciela HusbandsKlein. He was rushing to make it to his appointment and had substernal chest pain which lasted for more than one hour. He describes chronic symptoms of exertional chest pain with moderate activities which usually subsides with rest. He reports no change in the pattern of this chest discomfort. He was started on Ranexa by Dr. Graciela HusbandsKlein.     No Known Allergies   Current Outpatient Prescriptions on File Prior to Visit  Medication Sig Dispense Refill  . aspirin 81 MG tablet Take 81 mg by mouth daily.      Marland Kitchen. gabapentin (NEURONTIN) 600 MG tablet Take 600 mg by mouth daily.    . metoprolol tartrate (LOPRESSOR) 25 MG tablet Take 1 tablet (25 mg total) by mouth 2 (two) times daily. 90 tablet 0  . Multiple Vitamin (MULTIVITAMIN) tablet Take 1 tablet by mouth daily.      . nitroGLYCERIN (NITROSTAT) 0.4 MG SL tablet Place 0.4 mg under the tongue every 5 (five) minutes as needed.      Marland Kitchen. omeprazole (PRILOSEC) 20 MG capsule Take 20 mg by mouth daily.     . ranolazine (RANEXA) 500 MG 12 hr tablet Take 1 tablet (500 mg total) by mouth 2 (two) times daily. 60 tablet 12  . sertraline (ZOLOFT) 50 MG tablet Take 50 mg by mouth daily.    . traZODone (DESYREL) 100 MG tablet Take 200 mg by mouth daily.  0  . vitamin B-12 (CYANOCOBALAMIN) 1000 MCG  tablet Take 1,000 mcg by mouth daily.     No current facility-administered medications on file prior to visit.     Past Medical History  Diagnosis Date  . Sinus node dysfunction     s/p PPM  . Ventricular tachycardia, non-sustained   . HLD (hyperlipidemia)   . Pacemaker   . CAD (coronary artery disease)     s/p CABG in 2001; lexiscan 2011  no ischemia and nl  LV function     Past Surgical History  Procedure Laterality Date  . Bladder stone removal  2005  . Pacemaker insertion    . Coronary artery bypass graft  2001    LIMA to LAD, SVG to RCA, SVG to ramus, sequential SVG to  first and second diagonal.  . Insert / replace / remove pacemaker    . Cataract surgery      bilateral  . Cardiac catheterization  09/2008    patent grafts. 80% proximal stneosis in left circumflex artery (subsequent stress test showed no ischemia)     Family History  Problem Relation Age of Onset  . Heart attack    . Cancer Mother   . Heart disease Father   . Heart attack Father   . Heart disease Sister   . Heart attack Brother   . Heart disease Brother      History   Social History  .  Marital Status: Married    Spouse Name: N/A    Number of Children: 1  . Years of Education: N/A   Occupational History  . retired    Social History Main Topics  . Smoking status: Never Smoker   . Smokeless tobacco: Not on file  . Alcohol Use: No  . Drug Use: No  . Sexual Activity: Not on file   Other Topics Concern  . Not on file   Social History Narrative     PHYSICAL EXAM   BP 98/64 mmHg  Pulse 73  Ht 5\' 7"  (1.702 m)  Wt 146 lb (66.225 kg)  BMI 22.86 kg/m2 Constitutional: He is oriented to person, place, and time. He appears well-developed and well-nourished. No distress.  HENT: No nasal discharge.  Head: Normocephalic and atraumatic.  Eyes: Pupils are equal and round. Right eye exhibits no discharge. Left eye exhibits no discharge.  Neck: Normal range of motion. Neck supple. No  JVD present. No thyromegaly present.  Cardiovascular: Normal rate, regular rhythm, normal heart sounds and. Exam reveals no gallop and no friction rub. No murmur heard.  Pulmonary/Chest: Effort normal and breath sounds normal. No stridor. No respiratory distress. He has no wheezes. He has no rales. He exhibits no tenderness.  Abdominal: Soft. Bowel sounds are normal. He exhibits no distension. There is no tenderness. There is no rebound and no guarding.  Musculoskeletal: Normal range of motion. He exhibits no edema and no tenderness.  Neurological: He is alert and oriented to person, place, and time. Coordination normal.  Skin: Skin is warm and dry. No rash noted. He is not diaphoretic. No erythema. No pallor.  Psychiatric: He has a normal mood and affect. His behavior is normal. Judgment and thought content normal.       UJW:[id number]:Normal sinus rhythm with sinus arrhythmia Nonspecific T wave changes. -Left atrial enlargement.   BORDERLINE    ASSESSMENT AND PLAN

## 2013-12-17 ENCOUNTER — Encounter: Payer: Self-pay | Admitting: Internal Medicine

## 2013-12-20 ENCOUNTER — Ambulatory Visit: Payer: Medicare Other | Admitting: Cardiovascular Disease

## 2014-01-14 ENCOUNTER — Ambulatory Visit (INDEPENDENT_AMBULATORY_CARE_PROVIDER_SITE_OTHER): Payer: Medicare Other | Admitting: Cardiovascular Disease

## 2014-01-14 ENCOUNTER — Encounter: Payer: Self-pay | Admitting: Cardiovascular Disease

## 2014-01-14 ENCOUNTER — Encounter (INDEPENDENT_AMBULATORY_CARE_PROVIDER_SITE_OTHER): Payer: Self-pay

## 2014-01-14 VITALS — BP 132/78 | HR 78 | Ht 67.0 in | Wt 148.5 lb

## 2014-01-14 DIAGNOSIS — I25118 Atherosclerotic heart disease of native coronary artery with other forms of angina pectoris: Secondary | ICD-10-CM

## 2014-01-14 DIAGNOSIS — E785 Hyperlipidemia, unspecified: Secondary | ICD-10-CM

## 2014-01-14 NOTE — Assessment & Plan Note (Signed)
He is doing significantly better after the addition of Ranexa. I recommend continuing medical therapy.

## 2014-01-14 NOTE — Assessment & Plan Note (Signed)
He reports intolerance to statins due to fatigue and myalgia. He was most recently on atorvastatin and stopped it on his own.

## 2014-01-14 NOTE — Progress Notes (Signed)
HPI  [name] is an 81 year old gentleman who is here today for a follow-up visit  regarding coronary artery disease.  He has known history of coronary artery disease status post CABG in 2001. Most recent cardiac catheterization in 2010 showed patent grafts with 80% ostial left circumflex stenosis. Subsequent nuclear stress test in 2011 showed no evidence of ischemia with normal ejection fraction. He is also status post dual-chamber pacemaker placement monitored by Dr. Graciela HusbandsKlein. Headache is improved from before.  He was seen last month for a prolonged episode of chest pain. He declined cardiac catheterization. He was started on Ranexa. He reports significant improvement in his symptoms.    No Known Allergies   Current Outpatient Prescriptions on File Prior to Visit  Medication Sig Dispense Refill  . aspirin 81 MG tablet Take 81 mg by mouth daily.      Marland Kitchen. gabapentin (NEURONTIN) 600 MG tablet Take 600 mg by mouth daily.    . metoprolol tartrate (LOPRESSOR) 25 MG tablet Take 1 tablet (25 mg total) by mouth 2 (two) times daily. 90 tablet 0  . Multiple Vitamin (MULTIVITAMIN) tablet Take 1 tablet by mouth daily.      . nitroGLYCERIN (NITROSTAT) 0.4 MG SL tablet Place 0.4 mg under the tongue every 5 (five) minutes as needed.      Marland Kitchen. omeprazole (PRILOSEC) 20 MG capsule Take 20 mg by mouth daily.     . ranolazine (RANEXA) 500 MG 12 hr tablet Take 1 tablet (500 mg total) by mouth 2 (two) times daily. 60 tablet 12  . sertraline (ZOLOFT) 50 MG tablet Take 50 mg by mouth daily.    . traZODone (DESYREL) 100 MG tablet Take 200 mg by mouth daily.  0  . vitamin B-12 (CYANOCOBALAMIN) 1000 MCG tablet Take 1,000 mcg by mouth daily.     No current facility-administered medications on file prior to visit.     Past Medical History  Diagnosis Date  . Sinus node dysfunction     s/p PPM  . Ventricular tachycardia, non-sustained   . HLD (hyperlipidemia)   . Pacemaker   . CAD (coronary artery disease)    s/p CABG in 2001; lexiscan 2011  no ischemia and nl  LV function     Past Surgical History  Procedure Laterality Date  . Bladder stone removal  2005  . Pacemaker insertion    . Coronary artery bypass graft  2001    LIMA to LAD, SVG to RCA, SVG to ramus, sequential SVG to  first and second diagonal.  . Insert / replace / remove pacemaker    . Cataract surgery      bilateral  . Cardiac catheterization  09/2008    patent grafts. 80% proximal stneosis in left circumflex artery (subsequent stress test showed no ischemia)     Family History  Problem Relation Age of Onset  . Heart attack    . Cancer Mother   . Heart disease Father   . Heart attack Father   . Heart disease Sister   . Heart attack Brother   . Heart disease Brother      History   Social History  . Marital Status: Married    Spouse Name: N/A    Number of Children: 1  . Years of Education: N/A   Occupational History  . retired    Social History Main Topics  . Smoking status: Never Smoker   . Smokeless tobacco: Not on file  . Alcohol Use: No  .  Drug Use: No  . Sexual Activity: Not on file   Other Topics Concern  . Not on file   Social History Narrative     PHYSICAL EXAM   BP 132/78 mmHg  Pulse 78  Ht  (1.702 m)  Wt 148 lb 8 oz (67.359 kg)  BMI 23.25 kg/m2 Constitutional: He is oriented to person, place, and time. He appears well-developed and well-nourished. No distress.  HENT: No nasal discharge.  Head: Normocephalic and atraumatic.  Eyes: Pupils are equal and round. Right eye exhibits no discharge. Left eye exhibits no discharge.  Neck: Normal range of motion. Neck supple. No JVD present. No thyromegaly present.  Cardiovascular: Normal rate, regular rhythm, normal heart sounds and. Exam reveals no gallop and no friction rub. No murmur heard.  Pulmonary/Chest: Effort normal and breath sounds normal. No stridor. No respiratory distress. He has no wheezes. He has no rales. He exhibits no  tenderness.  Abdominal: Soft. Bowel sounds are normal. He exhibits no distension. There is no tenderness. There is no rebound and no guarding.  Musculoskeletal: Normal range of motion. He exhibits no edema and no tenderness.  Neurological: He is alert and oriented to person, place, and time. Coordination normal.  Skin: Skin is warm and dry. No rash noted. He is not diaphoretic. No erythema. No pallor.  Psychiatric: He has a normal mood and affect. His behavior is normal. Judgment and thought content normal.         ASSESSMENT AND PLAN

## 2014-01-14 NOTE — Patient Instructions (Signed)
Continue same medications.   Your physician wants you to follow-up in: 6 months.  You will receive a reminder letter in the mail two months in advance. If you don't receive a letter, please call our office to schedule the follow-up appointment.  

## 2014-01-24 ENCOUNTER — Other Ambulatory Visit: Payer: Self-pay

## 2014-01-24 MED ORDER — RANOLAZINE ER 500 MG PO TB12: 500.0000 mg | ORAL_TABLET | Freq: Two times a day (BID) | ORAL | Status: DC

## 2014-03-12 ENCOUNTER — Telehealth: Payer: Self-pay | Admitting: Cardiology

## 2014-03-12 ENCOUNTER — Ambulatory Visit (INDEPENDENT_AMBULATORY_CARE_PROVIDER_SITE_OTHER): Payer: Medicare Other | Admitting: *Deleted

## 2014-03-12 DIAGNOSIS — I495 Sick sinus syndrome: Secondary | ICD-10-CM

## 2014-03-12 LAB — MDC_IDC_ENUM_SESS_TYPE_REMOTE
Battery Voltage: 2.79 V
Brady Statistic AP VP Percent: 0 %
Brady Statistic AS VP Percent: 0 %
Brady Statistic AS VS Percent: 15 %
Date Time Interrogation Session: 20160308173207
Lead Channel Impedance Value: 496 Ohm
Lead Channel Impedance Value: 729 Ohm
Lead Channel Pacing Threshold Amplitude: 0.875 V
Lead Channel Pacing Threshold Amplitude: 1.375 V
Lead Channel Pacing Threshold Pulse Width: 0.4 ms
Lead Channel Sensing Intrinsic Amplitude: 16 mV
Lead Channel Setting Pacing Amplitude: 2 V
Lead Channel Setting Pacing Pulse Width: 0.4 ms
MDC IDC MSMT BATTERY IMPEDANCE: 346 Ohm
MDC IDC MSMT BATTERY REMAINING LONGEVITY: 109 mo
MDC IDC MSMT LEADCHNL RA PACING THRESHOLD PULSEWIDTH: 0.4 ms
MDC IDC SET LEADCHNL RV PACING AMPLITUDE: 2.75 V
MDC IDC SET LEADCHNL RV SENSING SENSITIVITY: 5.6 mV
MDC IDC STAT BRADY AP VS PERCENT: 85 %

## 2014-03-12 NOTE — Telephone Encounter (Signed)
Spoke with pt and reminded pt of remote transmission that is due today. Pt verbalized understanding.   

## 2014-03-12 NOTE — Progress Notes (Signed)
Remote pacemaker transmission.   

## 2014-03-21 ENCOUNTER — Encounter: Payer: Self-pay | Admitting: Cardiology

## 2014-03-27 ENCOUNTER — Encounter: Payer: Self-pay | Admitting: Internal Medicine

## 2014-05-21 ENCOUNTER — Other Ambulatory Visit: Payer: Self-pay | Admitting: Specialist

## 2014-05-21 DIAGNOSIS — G4452 New daily persistent headache (NDPH): Secondary | ICD-10-CM

## 2014-05-23 ENCOUNTER — Ambulatory Visit
Admission: RE | Admit: 2014-05-23 | Discharge: 2014-05-23 | Disposition: A | Discharge status: Home / Self Care | Payer: Medicare Other | Source: Ambulatory Visit | Attending: Specialist | Admitting: Specialist

## 2014-05-23 ENCOUNTER — Other Ambulatory Visit: Payer: Medicare Other

## 2014-05-23 DIAGNOSIS — G4452 New daily persistent headache (NDPH): Secondary | ICD-10-CM

## 2014-05-23 MED ORDER — IOHEXOL 350 MG/ML SOLN
75.0000 mL | Freq: Once | INTRAVENOUS | Status: AC | PRN
  Administered 2014-05-23: 75 mL via INTRAVENOUS

## 2014-06-10 DIAGNOSIS — I495 Sick sinus syndrome: Secondary | ICD-10-CM | POA: Diagnosis not present

## 2014-06-13 ENCOUNTER — Ambulatory Visit (INDEPENDENT_AMBULATORY_CARE_PROVIDER_SITE_OTHER): Payer: Medicare Other | Admitting: *Deleted

## 2014-06-13 DIAGNOSIS — I495 Sick sinus syndrome: Secondary | ICD-10-CM

## 2014-06-13 NOTE — Progress Notes (Signed)
Remote pacemaker transmission.   

## 2014-06-15 ENCOUNTER — Other Ambulatory Visit: Payer: Self-pay | Admitting: Cardiovascular Disease

## 2014-06-16 LAB — CUP PACEART REMOTE DEVICE CHECK
Battery Impedance: 371 Ohm
Battery Voltage: 2.79 V
Brady Statistic AP VP Percent: 0 %
Brady Statistic AS VP Percent: 0 %
Lead Channel Pacing Threshold Amplitude: 0.875 V
Lead Channel Pacing Threshold Amplitude: 1.625 V
Lead Channel Pacing Threshold Pulse Width: 0.4 ms
Lead Channel Sensing Intrinsic Amplitude: 16 mV
Lead Channel Setting Pacing Amplitude: 3.25 V
Lead Channel Setting Pacing Pulse Width: 0.4 ms
Lead Channel Setting Sensing Sensitivity: 5.6 mV
MDC IDC MSMT BATTERY REMAINING LONGEVITY: 105 mo
MDC IDC MSMT LEADCHNL RA IMPEDANCE VALUE: 496 Ohm
MDC IDC MSMT LEADCHNL RV IMPEDANCE VALUE: 687 Ohm
MDC IDC MSMT LEADCHNL RV PACING THRESHOLD PULSEWIDTH: 0.4 ms
MDC IDC SESS DTM: 20160606135345
MDC IDC SET LEADCHNL RA PACING AMPLITUDE: 2 V
MDC IDC STAT BRADY AP VS PERCENT: 85 %
MDC IDC STAT BRADY AS VS PERCENT: 14 %

## 2014-06-24 ENCOUNTER — Ambulatory Visit (INDEPENDENT_AMBULATORY_CARE_PROVIDER_SITE_OTHER): Payer: Medicare Other | Admitting: Cardiovascular Disease

## 2014-06-24 ENCOUNTER — Encounter: Payer: Self-pay | Admitting: Cardiovascular Disease

## 2014-06-24 VITALS — BP 102/64 | HR 69 | Ht 67.0 in | Wt 138.0 lb

## 2014-06-24 DIAGNOSIS — I25118 Atherosclerotic heart disease of native coronary artery with other forms of angina pectoris: Secondary | ICD-10-CM | POA: Diagnosis not present

## 2014-06-24 DIAGNOSIS — E785 Hyperlipidemia, unspecified: Secondary | ICD-10-CM

## 2014-06-24 DIAGNOSIS — Z950 Presence of cardiac pacemaker: Secondary | ICD-10-CM | POA: Diagnosis not present

## 2014-06-24 NOTE — Patient Instructions (Signed)
Medication Instructions: Continue same medications.   Labwork: None.   Procedures/Testing: None.   Follow-Up: 1 year with Dr. Media Pizzini  Any Additional Special Instructions Will Be Listed Below (If Applicable).   

## 2014-06-24 NOTE — Progress Notes (Signed)
HPI  [name] is an 81 year old gentleman who is here today for a follow-up visit  regarding coronary artery disease.  He has known history of coronary artery disease status post CABG in 2001. Most recent cardiac catheterization in 2010 showed patent grafts with 80% ostial left circumflex stenosis. Subsequent nuclear stress test in 2011 showed no evidence of ischemia with normal ejection fraction. He is also status post dual-chamber pacemaker placement monitored by Dr. Graciela Husbands. He has chronic headache and currently is getting localized sterile injections. He has been on Ranexa for stable angina with significant improvement in symptoms. He reports constipation which might be due to the medication but this usually responds to treatment.    No Known Allergies   Current Outpatient Prescriptions on File Prior to Visit  Medication Sig Dispense Refill  . aspirin 81 MG tablet Take 81 mg by mouth daily.      Marland Kitchen gabapentin (NEURONTIN) 600 MG tablet Take 600 mg by mouth 2 (two) times daily.     . metoprolol tartrate (LOPRESSOR) 25 MG tablet Take 1 tablet (25 mg total) by mouth 2 (two) times daily. 90 tablet 0  . Multiple Vitamin (MULTIVITAMIN) tablet Take 1 tablet by mouth daily.      . nitroGLYCERIN (NITROSTAT) 0.4 MG SL tablet Place 0.4 mg under the tongue every 5 (five) minutes as needed.      Marland Kitchen omeprazole (PRILOSEC) 20 MG capsule Take 20 mg by mouth daily.     Marland Kitchen RANEXA 500 MG 12 hr tablet Take 1 tablet by mouth two  times daily 180 tablet 3  . sertraline (ZOLOFT) 50 MG tablet Take 50 mg by mouth daily.    . traZODone (DESYREL) 100 MG tablet Take 200 mg by mouth daily.  0  . vitamin B-12 (CYANOCOBALAMIN) 1000 MCG tablet Take 1,000 mcg by mouth daily.     No current facility-administered medications on file prior to visit.     Past Medical History  Diagnosis Date  . Sinus node dysfunction     s/p PPM  . Ventricular tachycardia, non-sustained   . HLD (hyperlipidemia)   . Pacemaker   .  CAD (coronary artery disease)     s/p CABG in 2001; lexiscan 2011  no ischemia and nl  LV function     Past Surgical History  Procedure Laterality Date  . Bladder stone removal  2005  . Pacemaker insertion    . Coronary artery bypass graft  2001    LIMA to LAD, SVG to RCA, SVG to ramus, sequential SVG to  first and second diagonal.  . Insert / replace / remove pacemaker    . Cataract surgery      bilateral  . Cardiac catheterization  09/2008    patent grafts. 80% proximal stneosis in left circumflex artery (subsequent stress test showed no ischemia)     Family History  Problem Relation Age of Onset  . Heart attack    . Cancer Mother   . Heart disease Father   . Heart attack Father   . Heart disease Sister   . Heart attack Brother   . Heart disease Brother      History   Social History  . Marital Status: Married    Spouse Name: N/A  . Number of Children: 1  . Years of Education: N/A   Occupational History  . retired    Social History Main Topics  . Smoking status: Never Smoker   . Smokeless tobacco: Not  on file  . Alcohol Use: No  . Drug Use: No  . Sexual Activity: Not on file   Other Topics Concern  . Not on file   Social History Narrative     PHYSICAL EXAM   BP 102/64 mmHg  Pulse 69  Ht 5\' 7"  (1.702 m)  Wt 138 lb (62.596 kg)  BMI 21.61 kg/m2 Constitutional: He is oriented to person, place, and time. He appears well-developed and well-nourished. No distress.  HENT: No nasal discharge.  Head: Normocephalic and atraumatic.  Eyes: Pupils are equal and round. Right eye exhibits no discharge. Left eye exhibits no discharge.  Neck: Normal range of motion. Neck supple. No JVD present. No thyromegaly present.  Cardiovascular: Normal rate, regular rhythm, normal heart sounds and. Exam reveals no gallop and no friction rub. No murmur heard.  Pulmonary/Chest: Effort normal and breath sounds normal. No stridor. No respiratory distress. He has no wheezes. He  has no rales. He exhibits no tenderness.  Abdominal: Soft. Bowel sounds are normal. He exhibits no distension. There is no tenderness. There is no rebound and no guarding.  Musculoskeletal: Normal range of motion. He exhibits no edema and no tenderness.  Neurological: He is alert and oriented to person, place, and time. Coordination normal.  Skin: Skin is warm and dry. No rash noted. He is not diaphoretic. No erythema. No pallor.  Psychiatric: He has a normal mood and affect. His behavior is normal. Judgment and thought content normal.     OOI:[id number]  Rhythm  -Anterolateral ST-elevation -repolarization variant.   PROBABLY NORMAL    ASSESSMENT AND PLAN

## 2014-06-24 NOTE — Assessment & Plan Note (Signed)
He reports intolerance to statins due to fatigue and myalgia. He was most recently on atorvastatin and stopped it on his own. Lipid profile last year in August was actually optimal.

## 2014-06-24 NOTE — Assessment & Plan Note (Signed)
He is doing well from a cardiac standpoint and seems to be stable. His symptoms improved significantly with Ranexa. Continue same medications.

## 2014-06-26 ENCOUNTER — Encounter: Payer: Self-pay | Admitting: Cardiology

## 2014-07-03 ENCOUNTER — Encounter: Payer: Self-pay | Admitting: Internal Medicine

## 2014-09-05 ENCOUNTER — Telehealth: Payer: Self-pay | Admitting: Internal Medicine

## 2014-09-05 NOTE — Telephone Encounter (Signed)
New message    Next time pt orders prescriptions it will be $466 Pt is wanting to know if there could be a change in the dosage or # of times a day that pt takes renaxa Please call to discuss

## 2014-09-06 NOTE — Telephone Encounter (Signed)
Unfortunately, there is no generic form for Ranexa. He is on the small dose of this. Thus, the dose cannot be decreased. He can try taking it once a day instead of twice a day to see if that works the same.

## 2014-09-10 MED ORDER — RANOLAZINE ER 500 MG PO TB12: 500.0000 mg | ORAL_TABLET | Freq: Every day | ORAL | Status: DC

## 2014-09-10 NOTE — Telephone Encounter (Signed)
Left message on pt's vm w/ Dr. Jari Sportsman recommendation.  Asked pt to call back w/ any questions or concerns.

## 2014-09-12 ENCOUNTER — Ambulatory Visit (INDEPENDENT_AMBULATORY_CARE_PROVIDER_SITE_OTHER): Payer: Medicare Other | Admitting: *Deleted

## 2014-09-12 DIAGNOSIS — I495 Sick sinus syndrome: Secondary | ICD-10-CM

## 2014-09-12 NOTE — Progress Notes (Signed)
Remote pacemaker transmission.   

## 2014-09-25 LAB — CUP PACEART REMOTE DEVICE CHECK
Battery Voltage: 2.8 V
Brady Statistic AP VP Percent: 0 %
Brady Statistic AP VS Percent: 86 %
Brady Statistic AS VP Percent: 0 %
Date Time Interrogation Session: 20160908104501
Lead Channel Impedance Value: 627 Ohm
Lead Channel Pacing Threshold Amplitude: 0.75 V
Lead Channel Pacing Threshold Amplitude: 1.75 V
Lead Channel Pacing Threshold Pulse Width: 0.4 ms
Lead Channel Setting Pacing Amplitude: 3.5 V
Lead Channel Setting Pacing Pulse Width: 0.46 ms
Lead Channel Setting Sensing Sensitivity: 5.6 mV
MDC IDC MSMT BATTERY IMPEDANCE: 420 Ohm
MDC IDC MSMT BATTERY REMAINING LONGEVITY: 99 mo
MDC IDC MSMT LEADCHNL RA IMPEDANCE VALUE: 490 Ohm
MDC IDC MSMT LEADCHNL RV PACING THRESHOLD PULSEWIDTH: 0.4 ms
MDC IDC MSMT LEADCHNL RV SENSING INTR AMPL: 16 mV
MDC IDC SET LEADCHNL RA PACING AMPLITUDE: 2 V
MDC IDC STAT BRADY AS VS PERCENT: 14 %

## 2014-10-16 ENCOUNTER — Encounter: Payer: Self-pay | Admitting: Cardiology

## 2014-10-28 ENCOUNTER — Encounter: Payer: Self-pay | Admitting: Internal Medicine

## 2015-01-07 ENCOUNTER — Encounter: Payer: Self-pay | Admitting: Internal Medicine

## 2015-01-07 ENCOUNTER — Ambulatory Visit (INDEPENDENT_AMBULATORY_CARE_PROVIDER_SITE_OTHER): Payer: Medicare Other | Admitting: Internal Medicine

## 2015-01-07 VITALS — BP 110/68 | HR 64 | Ht 67.0 in | Wt 148.4 lb

## 2015-01-07 DIAGNOSIS — Z950 Presence of cardiac pacemaker: Secondary | ICD-10-CM

## 2015-01-07 DIAGNOSIS — R001 Bradycardia, unspecified: Secondary | ICD-10-CM

## 2015-01-07 LAB — CUP PACEART INCLINIC DEVICE CHECK
Battery Remaining Longevity: 99 mo
Brady Statistic AP VP Percent: 0 %
Brady Statistic AP VS Percent: 85 %
Brady Statistic AS VP Percent: 0 %
Brady Statistic AS VS Percent: 15 %
Date Time Interrogation Session: 20170103141104
Implantable Lead Implant Date: 20100910
Implantable Lead Location: 753860
Implantable Lead Model: 5076
Implantable Lead Model: 5076
Lead Channel Pacing Threshold Amplitude: 0.75 V
Lead Channel Pacing Threshold Amplitude: 1.5 V
Lead Channel Pacing Threshold Pulse Width: 0.4 ms
Lead Channel Pacing Threshold Pulse Width: 0.4 ms
Lead Channel Sensing Intrinsic Amplitude: 22.4 mV
Lead Channel Setting Pacing Amplitude: 2 V
Lead Channel Setting Pacing Pulse Width: 0.4 ms
MDC IDC LEAD IMPLANT DT: 20100910
MDC IDC LEAD LOCATION: 753859
MDC IDC MSMT BATTERY IMPEDANCE: 444 Ohm
MDC IDC MSMT BATTERY VOLTAGE: 2.79 V
MDC IDC MSMT LEADCHNL RA IMPEDANCE VALUE: 503 Ohm
MDC IDC MSMT LEADCHNL RV IMPEDANCE VALUE: 714 Ohm
MDC IDC SET LEADCHNL RV PACING AMPLITUDE: 3.25 V
MDC IDC SET LEADCHNL RV SENSING SENSITIVITY: 5.6 mV

## 2015-01-07 NOTE — Progress Notes (Signed)
Patient Care Team: Barron Alvine, MD as PCP - General (Family Medicine)   HPI  [name] is a 82 y.o. male Seen in followup for a pacemaker implanted September 2010 for symptomatic bradycardia.  He has a history of coronary artery disease with prior bypass surgery and PCI of the circumflex lesion September 2010. Because of symptoms of exertional shortness of breath and chest pain that was somewhat atypical he underwent a lexiscan stress testing August 2011 demonstrating normal left ventricular function and no ischemia   He has chronic headaches.  Chest pains are minimal. The remaining atypical and nonexertional. He denies dyspnea on exertion or nocturnal dyspnea. He has occasional peripheral edema.    Past Medical History  Diagnosis Date  . Sinus node dysfunction (HCC)     s/p PPM  . Ventricular tachycardia, non-sustained (HCC)   . HLD (hyperlipidemia)   . Pacemaker   . CAD (coronary artery disease)     s/p CABG in 2001; lexiscan 2011  no ischemia and nl  LV function    Past Surgical History  Procedure Laterality Date  . Bladder stone removal  2005  . Pacemaker insertion    . Coronary artery bypass graft  2001    LIMA to LAD, SVG to RCA, SVG to ramus, sequential SVG to  first and second diagonal.  . Insert / replace / remove pacemaker    . Cataract surgery      bilateral  . Cardiac catheterization  09/2008    patent grafts. 80% proximal stneosis in left circumflex artery (subsequent stress test showed no ischemia)    Current Outpatient Prescriptions  Medication Sig Dispense Refill  . aspirin 81 MG tablet Take 81 mg by mouth daily.      . flurbiprofen (ANSAID) 100 MG tablet Take 100 mg by mouth 2 (two) times daily as needed (headaches).    . gabapentin (NEURONTIN) 600 MG tablet Take 600 mg by mouth daily.     . metoprolol tartrate (LOPRESSOR) 25 MG tablet Take 1 tablet (25 mg total) by mouth 2 (two) times daily. 90 tablet 0  . Multiple Vitamin (MULTIVITAMIN)  tablet Take 1 tablet by mouth daily.      . nitroGLYCERIN (NITROSTAT) 0.4 MG SL tablet Place 0.4 mg under the tongue every 5 (five) minutes as needed for chest pain.     Marland Kitchen omeprazole (PRILOSEC) 20 MG capsule Take 20 mg by mouth daily.     . promethazine (PHENERGAN) 25 MG tablet Take one - two (1-2) tablets (25 mg - 50 mg total) by mouth three (3) times daily as needed for nausea/vomitimg.    . ranolazine (RANEXA) 500 MG 12 hr tablet Take 500 mg by mouth 2 (two) times daily.    . sertraline (ZOLOFT) 50 MG tablet Take 50 mg by mouth daily.    . traZODone (DESYREL) 100 MG tablet Take 200 mg by mouth at bedtime.   0  . vitamin B-12 (CYANOCOBALAMIN) 1000 MCG tablet Take 1,000 mcg by mouth daily.     No current facility-administered medications for this visit.    No Known Allergies  Review of Systems negative except from HPI and PMH  Physical Exam BP 110/68 mmHg  Pulse 64  Ht 5\' 7"  (1.702 m)  Wt 148 lb 6.4 oz (67.314 kg)  BMI 23.24 kg/m2 Well developed and nourished in no acute distress HENT normal Neck supple with JVP-flat Carotids brisk and full without bruits Clear Device pocket well healed; without  hematoma or erythema.  There is no tethering  Regular rate and rhythm, no murmurs or gallops Abd-soft with active BS without hepatomegaly No Clubbing cyanosis edema Skin-warm and dry A & Oriented  Grossly normal sensory and motor function     Assessment and  Plan   ischemic heart disease  With prior bypass surgery   Sinus bradycardia   Implantable pacemaker-Medtronic The patient's device was interrogated.  The information was reviewed. No changes were made in the programming.      headaches -chronic ew the patient is quite stable.

## 2015-01-07 NOTE — Patient Instructions (Signed)
Medication Instructions: - no changes  Labwork: - none  Procedures/Testing: - none  Follow-Up: - Remote monitoring is used to monitor your Pacemaker of ICD from home. This monitoring reduces the number of office visits required to check your device to one time per year. It allows us to keep an eye on the functioning of your device to ensure it is working properly. You are scheduled for a device check from home on 04/08/15. You may send your transmission at any time that day. If you have a wireless device, the transmission will be sent automatically. After your physician reviews your transmission, you will receive a postcard with your next transmission date.  - Your physician wants you to follow-up in: 1 year with Dr. Klein. You will receive a reminder letter in the mail two months in advance. If you don't receive a letter, please call our office to schedule the follow-up appointment.  Any Additional Special Instructions Will Be Listed Below (If Applicable).   

## 2015-04-08 ENCOUNTER — Ambulatory Visit (INDEPENDENT_AMBULATORY_CARE_PROVIDER_SITE_OTHER): Payer: Medicare Other | Admitting: *Deleted

## 2015-04-08 ENCOUNTER — Telehealth: Payer: Self-pay | Admitting: Cardiology

## 2015-04-08 DIAGNOSIS — Z950 Presence of cardiac pacemaker: Secondary | ICD-10-CM

## 2015-04-08 DIAGNOSIS — I495 Sick sinus syndrome: Secondary | ICD-10-CM | POA: Diagnosis not present

## 2015-04-08 NOTE — Progress Notes (Signed)
Remote pacemaker transmission.   

## 2015-04-08 NOTE — Telephone Encounter (Signed)
LMOVM reminding pt to send remote transmission.   

## 2015-05-19 LAB — CUP PACEART REMOTE DEVICE CHECK
Battery Impedance: 517 Ohm
Battery Remaining Longevity: 93 mo
Battery Voltage: 2.79 V
Brady Statistic AP VS Percent: 83 %
Brady Statistic AS VS Percent: 17 %
Date Time Interrogation Session: 20170404183026
Implantable Lead Implant Date: 20100910
Implantable Lead Location: 753860
Implantable Lead Model: 5076
Implantable Lead Model: 5076
Lead Channel Pacing Threshold Pulse Width: 0.4 ms
Lead Channel Sensing Intrinsic Amplitude: 16 mV
Lead Channel Setting Pacing Amplitude: 2 V
Lead Channel Setting Pacing Pulse Width: 0.4 ms
Lead Channel Setting Sensing Sensitivity: 5.6 mV
MDC IDC LEAD IMPLANT DT: 20100910
MDC IDC LEAD LOCATION: 753859
MDC IDC MSMT LEADCHNL RA IMPEDANCE VALUE: 489 Ohm
MDC IDC MSMT LEADCHNL RA PACING THRESHOLD AMPLITUDE: 0.75 V
MDC IDC MSMT LEADCHNL RA PACING THRESHOLD PULSEWIDTH: 0.4 ms
MDC IDC MSMT LEADCHNL RV IMPEDANCE VALUE: 701 Ohm
MDC IDC MSMT LEADCHNL RV PACING THRESHOLD AMPLITUDE: 1.5 V
MDC IDC SET LEADCHNL RV PACING AMPLITUDE: 3 V
MDC IDC STAT BRADY AP VP PERCENT: 0 %
MDC IDC STAT BRADY AS VP PERCENT: 0 %

## 2015-05-20 ENCOUNTER — Encounter: Payer: Self-pay | Admitting: Cardiology

## 2015-06-24 ENCOUNTER — Ambulatory Visit (INDEPENDENT_AMBULATORY_CARE_PROVIDER_SITE_OTHER): Payer: Medicare Other | Admitting: Cardiovascular Disease

## 2015-06-24 ENCOUNTER — Encounter: Payer: Self-pay | Admitting: Cardiovascular Disease

## 2015-06-24 ENCOUNTER — Other Ambulatory Visit: Payer: Self-pay

## 2015-06-24 VITALS — BP 110/74 | HR 67 | Ht 67.0 in | Wt 146.4 lb

## 2015-06-24 DIAGNOSIS — I2510 Atherosclerotic heart disease of native coronary artery without angina pectoris: Secondary | ICD-10-CM | POA: Diagnosis not present

## 2015-06-24 DIAGNOSIS — R001 Bradycardia, unspecified: Secondary | ICD-10-CM

## 2015-06-24 DIAGNOSIS — Z950 Presence of cardiac pacemaker: Secondary | ICD-10-CM

## 2015-06-24 MED ORDER — RANOLAZINE ER 500 MG PO TB12: 500.0000 mg | ORAL_TABLET | Freq: Two times a day (BID) | ORAL | Status: DC

## 2015-06-24 NOTE — Progress Notes (Signed)
Cardiology Office Note   Date:  06/24/2015   ID:  [name], DOB September 29, 1927, MRN [id number]  PCP:  Barron Alvine, MD  Cardiologist:   Lorine Bears, MD   Chief Complaint  Patient presents with  . Follow-up    no chest pain, a little sob with exertion and no swelling. no other complaints,.      History of Present Illness: [name] is a 82 y.o. male who presents for  a follow-up visit  regarding coronary artery disease.  He has known history of coronary artery disease status post CABG in 2001. Most recent cardiac catheterization in 2010 showed patent grafts with 80% ostial left circumflex stenosis. Subsequent nuclear stress test in 2011 showed no evidence of ischemia with normal ejection fraction. He is also status post dual-chamber pacemaker placement monitored by Dr. Graciela Husbands. He has chronic headache . He has been on Ranexa for stable angina with significant improvement in symptoms. He reports Substernal chest pain only with extreme activities. No significant shortness of breath. No dizziness or palpitations.  Past Medical History  Diagnosis Date  . Sinus node dysfunction (HCC)     s/p PPM  . Ventricular tachycardia, non-sustained (HCC)   . HLD (hyperlipidemia)   . Pacemaker   . CAD (coronary artery disease)     s/p CABG in 2001; lexiscan 2011  no ischemia and nl  LV function    Past Surgical History  Procedure Laterality Date  . Bladder stone removal  2005  . Pacemaker insertion    . Coronary artery bypass graft  2001    LIMA to LAD, SVG to RCA, SVG to ramus, sequential SVG to  first and second diagonal.  . Insert / replace / remove pacemaker    . Cataract surgery      bilateral  . Cardiac catheterization  09/2008    patent grafts. 80% proximal stneosis in left circumflex artery (subsequent stress test showed no ischemia)     Current Outpatient Prescriptions  Medication Sig Dispense Refill  . aspirin 81 MG tablet Take 81 mg by mouth daily.      .  flurbiprofen (ANSAID) 100 MG tablet Take 100 mg by mouth 2 (two) times daily as needed (headaches).    . gabapentin (NEURONTIN) 600 MG tablet Take 600 mg by mouth daily.     . metoprolol tartrate (LOPRESSOR) 25 MG tablet Take 1 tablet (25 mg total) by mouth 2 (two) times daily. 90 tablet 0  . Multiple Vitamin (MULTIVITAMIN) tablet Take 1 tablet by mouth daily.      . nitroGLYCERIN (NITROSTAT) 0.4 MG SL tablet Place 0.4 mg under the tongue every 5 (five) minutes as needed for chest pain.     Marland Kitchen omeprazole (PRILOSEC) 20 MG capsule Take 20 mg by mouth daily.     . promethazine (PHENERGAN) 25 MG tablet Take one - two (1-2) tablets (25 mg - 50 mg total) by mouth three (3) times daily as needed for nausea/vomitimg.    . sertraline (ZOLOFT) 50 MG tablet Take 50 mg by mouth daily.    . traZODone (DESYREL) 100 MG tablet Take 200 mg by mouth at bedtime.   0  . vitamin B-12 (CYANOCOBALAMIN) 1000 MCG tablet Take 1,000 mcg by mouth daily.    . ranolazine (RANEXA) 500 MG 12 hr tablet Take 1 tablet (500 mg total) by mouth 2 (two) times daily. 60 tablet 11   No current facility-administered medications for this visit.    Allergies:  Review of patient's allergies indicates no known allergies.    Social History:  The patient  reports that he has never smoked. He has never used smokeless tobacco. He reports that he does not drink alcohol or use illicit drugs.   Family History:  The patient's family history includes Cancer in his mother; Heart attack in his brother and father; Heart disease in his brother, father, and sister.    ROS:  Please see the history of present illness.   Otherwise, review of systems are positive for none.   All other systems are reviewed and negative.    PHYSICAL EXAM: VS:  BP 110/74 mmHg  Pulse 67  Ht 5\' 7"  (1.702 m)  Wt 146 lb 6.4 oz (66.407 kg)  BMI 22.92 kg/m2 , BMI Body mass index is 22.92 kg/(m^2). GEN: Well nourished, well developed, in no acute distress HEENT:  normal Neck: no JVD, carotid bruits, or masses Cardiac: RRR; no murmurs, rubs, or gallops,no edema  Respiratory:  clear to auscultation bilaterally, normal work of breathing GI: soft, nontender, nondistended, + BS MS: no deformity or atrophy Skin: warm and dry, no rash Neuro:  Strength and sensation are intact Psych: euthymic mood, full affect   EKG:  EKG is ordered today. The ekg ordered today demonstrates atrial paced rhythm with no significant ST or T wave changes.   Recent Labs: No results found for requested labs within last 365 days.    Lipid Panel    Component Value Date/Time   CHOL  09/12/2008 0145    133        ATP III CLASSIFICATION:  <200     mg/dL   Desirable  161-096  mg/dL   Borderline High  >=045    mg/dL   High          TRIG 75 09/12/2008 0145   HDL 43 09/12/2008 0145   CHOLHDL 3.1 09/12/2008 0145   VLDL 15 09/12/2008 0145   LDLCALC  09/12/2008 0145    75        Total Cholesterol/HDL:CHD Risk Coronary Heart Disease Risk Table                     Men   Women  1/2 Average Risk   3.4   3.3  Average Risk       5.0   4.4  2 X Average Risk   9.6   7.1  3 X Average Risk  23.4   11.0        Use the calculated Patient Ratio above and the CHD Risk Table to determine the patient's CHD Risk.        ATP III CLASSIFICATION (LDL):  <100     mg/dL   Optimal  409-811  mg/dL   Near or Above                    Optimal  130-159  mg/dL   Borderline  914-782  mg/dL   High  >956     mg/dL   Very High      Wt Readings from Last 3 Encounters:  06/24/15 146 lb 6.4 oz (66.407 kg)  01/07/15 148 lb 6.4 oz (67.314 kg)  06/24/14 138 lb (62.596 kg)        ASSESSMENT AND PLAN:  1. Coronary artery disease involving native coronary arteries:He has stable angina controlled with Ranexa. No significant change in symptoms over the last year. Continue same treatment.   2. Hyperlipidemia:  The patient stopped taking atorvastatin in the past due to myalgia and fatigue and  does not want to go on a statin.  3. Status post dual-chamber pacemaker placement: Followed by Dr. Graciela HusbandsKlein.    Disposition:   FU with me in 1 year  Signed,  Lorine BearsMuhammad Valecia Beske, MD  06/24/2015 5:15 PM    [facility]

## 2015-06-24 NOTE — Patient Instructions (Signed)
Medication Instructions: Continue same medications.   Labwork: None.   Procedures/Testing: None.   Follow-Up: 1 year with Dr. Kawana Hegel.   Any Additional Special Instructions Will Be Listed Below (If Applicable).     If you need a refill on your cardiac medications before your next appointment, please call your pharmacy.   

## 2015-07-09 ENCOUNTER — Ambulatory Visit (INDEPENDENT_AMBULATORY_CARE_PROVIDER_SITE_OTHER): Payer: Medicare Other | Admitting: *Deleted

## 2015-07-09 ENCOUNTER — Telehealth: Payer: Self-pay | Admitting: Cardiology

## 2015-07-09 DIAGNOSIS — I495 Sick sinus syndrome: Secondary | ICD-10-CM

## 2015-07-09 NOTE — Progress Notes (Signed)
Remote pacemaker transmission.   

## 2015-07-09 NOTE — Telephone Encounter (Signed)
Spoke with pt and reminded pt of remote transmission that is due today. Pt verbalized understanding.   

## 2015-07-11 LAB — CUP PACEART REMOTE DEVICE CHECK
Battery Impedance: 567 Ohm
Battery Remaining Longevity: 90 mo
Battery Voltage: 2.79 V
Brady Statistic AP VP Percent: 0 %
Implantable Lead Implant Date: 20100910
Implantable Lead Location: 753860
Lead Channel Impedance Value: 503 Ohm
Lead Channel Pacing Threshold Amplitude: 0.875 V
Lead Channel Setting Pacing Amplitude: 2 V
Lead Channel Setting Pacing Amplitude: 2.75 V
Lead Channel Setting Pacing Pulse Width: 0.4 ms
MDC IDC LEAD IMPLANT DT: 20100910
MDC IDC LEAD LOCATION: 753859
MDC IDC MSMT LEADCHNL RA PACING THRESHOLD PULSEWIDTH: 0.4 ms
MDC IDC MSMT LEADCHNL RV IMPEDANCE VALUE: 739 Ohm
MDC IDC MSMT LEADCHNL RV PACING THRESHOLD AMPLITUDE: 1.375 V
MDC IDC MSMT LEADCHNL RV PACING THRESHOLD PULSEWIDTH: 0.4 ms
MDC IDC MSMT LEADCHNL RV SENSING INTR AMPL: 16 mV
MDC IDC SESS DTM: 20170705175730
MDC IDC SET LEADCHNL RV SENSING SENSITIVITY: 5.6 mV
MDC IDC STAT BRADY AP VS PERCENT: 82 %
MDC IDC STAT BRADY AS VP PERCENT: 0 %
MDC IDC STAT BRADY AS VS PERCENT: 17 %

## 2015-07-16 ENCOUNTER — Encounter: Payer: Self-pay | Admitting: Cardiology

## 2015-10-08 ENCOUNTER — Telehealth: Payer: Self-pay | Admitting: Cardiology

## 2015-10-08 ENCOUNTER — Ambulatory Visit (INDEPENDENT_AMBULATORY_CARE_PROVIDER_SITE_OTHER): Payer: Medicare Other | Admitting: *Deleted

## 2015-10-08 DIAGNOSIS — I495 Sick sinus syndrome: Secondary | ICD-10-CM

## 2015-10-08 NOTE — Telephone Encounter (Signed)
LMOVM reminding pt to send remote transmission.   

## 2015-10-09 NOTE — Progress Notes (Signed)
Remote pacemaker transmission.   

## 2015-10-10 ENCOUNTER — Encounter: Payer: Self-pay | Admitting: Cardiology

## 2015-10-17 LAB — CUP PACEART REMOTE DEVICE CHECK
Battery Impedance: 591 Ohm
Battery Remaining Longevity: 88 mo
Brady Statistic AP VP Percent: 0 %
Date Time Interrogation Session: 20171004182449
Implantable Lead Implant Date: 20100910
Implantable Lead Location: 753859
Implantable Lead Model: 5076
Implantable Lead Model: 5076
Lead Channel Impedance Value: 511 Ohm
Lead Channel Setting Pacing Amplitude: 2 V
Lead Channel Setting Pacing Amplitude: 2.75 V
Lead Channel Setting Sensing Sensitivity: 5.6 mV
MDC IDC LEAD IMPLANT DT: 20100910
MDC IDC LEAD LOCATION: 753860
MDC IDC MSMT BATTERY VOLTAGE: 2.79 V
MDC IDC MSMT LEADCHNL RA PACING THRESHOLD AMPLITUDE: 0.875 V
MDC IDC MSMT LEADCHNL RA PACING THRESHOLD PULSEWIDTH: 0.4 ms
MDC IDC MSMT LEADCHNL RV IMPEDANCE VALUE: 721 Ohm
MDC IDC MSMT LEADCHNL RV PACING THRESHOLD AMPLITUDE: 1.375 V
MDC IDC MSMT LEADCHNL RV PACING THRESHOLD PULSEWIDTH: 0.4 ms
MDC IDC SET LEADCHNL RV PACING PULSEWIDTH: 0.4 ms
MDC IDC STAT BRADY AP VS PERCENT: 82 %
MDC IDC STAT BRADY AS VP PERCENT: 0 %
MDC IDC STAT BRADY AS VS PERCENT: 18 %

## 2015-12-19 ENCOUNTER — Encounter: Payer: Self-pay | Admitting: Internal Medicine

## 2016-01-07 ENCOUNTER — Ambulatory Visit (INDEPENDENT_AMBULATORY_CARE_PROVIDER_SITE_OTHER): Payer: Medicare Other | Admitting: *Deleted

## 2016-01-07 DIAGNOSIS — I495 Sick sinus syndrome: Secondary | ICD-10-CM | POA: Diagnosis not present

## 2016-01-08 ENCOUNTER — Encounter: Payer: Medicare Other | Admitting: Internal Medicine

## 2016-01-08 NOTE — Progress Notes (Signed)
Remote pacemaker transmission.   

## 2016-01-09 ENCOUNTER — Encounter: Payer: Self-pay | Admitting: Cardiology

## 2016-01-15 LAB — CUP PACEART REMOTE DEVICE CHECK
Battery Impedance: 666 Ohm
Brady Statistic AP VP Percent: 0 %
Brady Statistic AP VS Percent: 80 %
Brady Statistic AS VP Percent: 0 %
Brady Statistic AS VS Percent: 20 %
Implantable Lead Implant Date: 20100910
Implantable Lead Location: 753859
Implantable Lead Model: 5076
Lead Channel Impedance Value: 482 Ohm
Lead Channel Impedance Value: 760 Ohm
Lead Channel Pacing Threshold Amplitude: 0.75 V
Lead Channel Pacing Threshold Pulse Width: 0.4 ms
Lead Channel Setting Pacing Amplitude: 2.5 V
Lead Channel Setting Sensing Sensitivity: 5.6 mV
MDC IDC LEAD IMPLANT DT: 20100910
MDC IDC LEAD LOCATION: 753860
MDC IDC MSMT BATTERY REMAINING LONGEVITY: 78 mo
MDC IDC MSMT BATTERY VOLTAGE: 2.79 V
MDC IDC MSMT LEADCHNL RV PACING THRESHOLD AMPLITUDE: 1.125 V
MDC IDC MSMT LEADCHNL RV PACING THRESHOLD PULSEWIDTH: 0.4 ms
MDC IDC PG IMPLANT DT: 20100910
MDC IDC SESS DTM: 20180104135737
MDC IDC SET LEADCHNL RA PACING AMPLITUDE: 2 V
MDC IDC SET LEADCHNL RV PACING PULSEWIDTH: 0.4 ms

## 2016-02-20 ENCOUNTER — Encounter: Payer: Medicare Other | Admitting: Internal Medicine

## 2016-02-24 ENCOUNTER — Encounter (INDEPENDENT_AMBULATORY_CARE_PROVIDER_SITE_OTHER): Payer: Self-pay

## 2016-02-24 ENCOUNTER — Ambulatory Visit (INDEPENDENT_AMBULATORY_CARE_PROVIDER_SITE_OTHER): Payer: Medicare Other | Admitting: Internal Medicine

## 2016-02-24 ENCOUNTER — Encounter: Payer: Self-pay | Admitting: Internal Medicine

## 2016-02-24 VITALS — BP 144/70 | HR 88 | Ht 66.0 in | Wt 149.8 lb

## 2016-02-24 DIAGNOSIS — R001 Bradycardia, unspecified: Secondary | ICD-10-CM

## 2016-02-24 DIAGNOSIS — Z950 Presence of cardiac pacemaker: Secondary | ICD-10-CM

## 2016-02-24 DIAGNOSIS — I495 Sick sinus syndrome: Secondary | ICD-10-CM | POA: Diagnosis not present

## 2016-02-24 NOTE — Progress Notes (Signed)
Patient Care Team: Barron Alvine, MD as PCP - General (Family Medicine)   HPI  [name] is a 83 y.o. male Seen in followup for a pacemaker implanted September 2010 for symptomatic bradycardia.   He has a history of coronary artery disease with prior bypass surgery and PCI of the circumflex lesion September 2010. Because of symptoms of exertional shortness of breath and chest pain that was somewhat atypical he underwent a lexiscan stress testing August 2011 demonstrating normal left ventricular function and no ischemia    Chest pains are minimal. The remaining atypical and nonexertional.  They have also been much improved on ranolazine. He has stable   dyspnea on exertion or nocturnal dyspnea. He has casional peripheral edema.  Only major complaint is headache  Past Medical History:  Diagnosis Date  . CAD (coronary artery disease)    s/p CABG in 2001; lexiscan 2011  no ischemia and nl  LV function  . HLD (hyperlipidemia)   . Pacemaker   . Sinus node dysfunction (HCC)    s/p PPM  . Ventricular tachycardia, non-sustained [facility])     Past Surgical History:  Procedure Laterality Date  . BLADDER STONE REMOVAL  2005  . CARDIAC CATHETERIZATION  09/2008   patent grafts. 80% proximal stneosis in left circumflex artery (subsequent stress test showed no ischemia)  . cataract surgery     bilateral  . CORONARY ARTERY BYPASS GRAFT  2001   LIMA to LAD, SVG to RCA, SVG to ramus, sequential SVG to  first and second diagonal.  . INSERT / REPLACE / REMOVE PACEMAKER    . PACEMAKER INSERTION      Current Outpatient Prescriptions  Medication Sig Dispense Refill  . aspirin 81 MG tablet Take 81 mg by mouth daily.      Marland Kitchen atorvastatin (LIPITOR) 10 MG tablet Take 10 mg by mouth daily.    . flurbiprofen (ANSAID) 100 MG tablet Take 100 mg by mouth 2 (two) times daily as needed (headaches).    . gabapentin (NEURONTIN) 600 MG tablet Take 600 mg by mouth daily.     . metoprolol tartrate  (LOPRESSOR) 25 MG tablet Take 1 tablet (25 mg total) by mouth 2 (two) times daily. 90 tablet 0  . Multiple Vitamin (MULTIVITAMIN) tablet Take 1 tablet by mouth daily.      . nitroGLYCERIN (NITROSTAT) 0.4 MG SL tablet Place 0.4 mg under the tongue every 5 (five) minutes as needed for chest pain.     Marland Kitchen omeprazole (PRILOSEC) 20 MG capsule Take 20 mg by mouth daily.     . promethazine (PHENERGAN) 25 MG tablet Take one - two (1-2) tablets (25 mg - 50 mg total) by mouth three (3) times daily as needed for nausea/vomitimg.    . ranolazine (RANEXA) 500 MG 12 hr tablet Take 1 tablet (500 mg total) by mouth 2 (two) times daily. 60 tablet 11  . sertraline (ZOLOFT) 50 MG tablet Take 50 mg by mouth daily.    . traZODone (DESYREL) 100 MG tablet Take 200 mg by mouth at bedtime.   0  . vitamin B-12 (CYANOCOBALAMIN) 1000 MCG tablet Take 1,000 mcg by mouth daily.     No current facility-administered medications for this visit.     No Known Allergies  Review of Systems negative except from HPI and PMH  Physical Exam BP (!) 144/70   Pulse 88   Ht 5\' 6"  (1.676 m)   Wt 149 lb 12.8 oz (67.9  kg)   SpO2 94%   BMI 24.18 kg/m  Well developed and nourished in no acute distress HENT normal Neck supple with JVP-flat Carotids brisk and full without bruits Clear Device pocket well healed; without hematoma or erythema.  There is no tethering  Regular rate and rhythm, no murmurs or gallops Abd-soft with active BS without hepatomegaly No Clubbing cyanosis edema Skin-warm and dry A & Oriented  Grossly normal sensory and motor function   ECG atrial pacing and I can't remember anything today   Assessment and  Plan  Ischemic heart disease  With prior bypass surgery  Sinus bradycardia  Implantable pacemaker-Medtronic The patient's device was interrogated.  The information was reviewed. No changes were made in the programming.     Headaches -chronic    Without symptoms of ischemia  Euvolemic continue  current meds  BP elevated will continue to follow

## 2016-02-24 NOTE — Patient Instructions (Signed)
Medication Instructions: Your physician recommends that you continue on your current medications as directed. Please refer to the Current Medication list given to you today.   Labwork: None Ordered  Procedures/Testing: None Ordered  Follow-Up: Remote monitoring is used to monitor your Pacemaker from home. This monitoring reduces the number of office visits required to check your device to one time per year. It allows us to keep an eye on the functioning of your device to ensure it is working properly. You are scheduled for a device check from home on 05/25/16. You may send your transmission at any time that day. If you have a wireless device, the transmission will be sent automatically. After your physician reviews your transmission, you will receive a postcard with your next transmission date.    Your physician wants you to follow-up in: 1 YEAR with Dr. Graciela HusbandsKlein. You will receive a reminder letter in the mail two months in advance. If you don't receive a letter, please call our office to schedule the follow-up appointment.   Any Additional Special Instructions Will Be Listed Below (If Applicable).     If you need a refill on your cardiac medications before your next appointment, please call your pharmacy.

## 2016-02-25 LAB — CUP PACEART INCLINIC DEVICE CHECK
Battery Voltage: 2.97 V
Brady Statistic RV Percent Paced: 0.4 %
Implantable Lead Implant Date: 20100910
Implantable Lead Location: 753860
Implantable Lead Model: 5076
Implantable Lead Model: 5076
Implantable Pulse Generator Implant Date: 20100910
Lead Channel Impedance Value: 482 Ohm
Lead Channel Impedance Value: 674 Ohm
Lead Channel Pacing Threshold Amplitude: 1.25 V
Lead Channel Pacing Threshold Pulse Width: 0.4 ms
Lead Channel Sensing Intrinsic Amplitude: 16 mV
Lead Channel Sensing Intrinsic Amplitude: 5.6 mV
Lead Channel Setting Pacing Amplitude: 2 V
Lead Channel Setting Pacing Pulse Width: 0.4 ms
Lead Channel Setting Sensing Sensitivity: 5.6 mV
MDC IDC LEAD IMPLANT DT: 20100910
MDC IDC LEAD LOCATION: 753859
MDC IDC MSMT LEADCHNL RA PACING THRESHOLD AMPLITUDE: 0.75 V
MDC IDC MSMT LEADCHNL RA PACING THRESHOLD PULSEWIDTH: 0.4 ms
MDC IDC SESS DTM: 20180221125221
MDC IDC SET LEADCHNL RV PACING AMPLITUDE: 2.5 V
MDC IDC STAT BRADY RA PERCENT PACED: 79 %

## 2016-05-26 ENCOUNTER — Ambulatory Visit (INDEPENDENT_AMBULATORY_CARE_PROVIDER_SITE_OTHER): Payer: Medicare Other | Admitting: *Deleted

## 2016-05-26 DIAGNOSIS — I495 Sick sinus syndrome: Secondary | ICD-10-CM | POA: Diagnosis not present

## 2016-05-26 NOTE — Progress Notes (Signed)
Remote pacemaker transmission.   

## 2016-05-27 ENCOUNTER — Encounter: Payer: Self-pay | Admitting: Cardiology

## 2016-05-27 LAB — CUP PACEART REMOTE DEVICE CHECK
Battery Impedance: 791 Ohm
Brady Statistic AP VP Percent: 0 %
Brady Statistic AS VP Percent: 0 %
Brady Statistic AS VS Percent: 18 %
Implantable Lead Implant Date: 20100910
Implantable Lead Location: 753859
Implantable Lead Model: 5076
Implantable Lead Model: 5076
Implantable Pulse Generator Implant Date: 20100910
Lead Channel Impedance Value: 476 Ohm
Lead Channel Impedance Value: 621 Ohm
Lead Channel Pacing Threshold Amplitude: 0.75 V
Lead Channel Pacing Threshold Pulse Width: 0.4 ms
Lead Channel Setting Pacing Amplitude: 3 V
Lead Channel Setting Sensing Sensitivity: 5.6 mV
MDC IDC LEAD IMPLANT DT: 20100910
MDC IDC LEAD LOCATION: 753860
MDC IDC MSMT BATTERY REMAINING LONGEVITY: 75 mo
MDC IDC MSMT BATTERY VOLTAGE: 2.78 V
MDC IDC MSMT LEADCHNL RA PACING THRESHOLD PULSEWIDTH: 0.4 ms
MDC IDC MSMT LEADCHNL RV PACING THRESHOLD AMPLITUDE: 1.5 V
MDC IDC SESS DTM: 20180522141714
MDC IDC SET LEADCHNL RA PACING AMPLITUDE: 2 V
MDC IDC SET LEADCHNL RV PACING PULSEWIDTH: 0.4 ms
MDC IDC STAT BRADY AP VS PERCENT: 82 %

## 2016-06-08 ENCOUNTER — Other Ambulatory Visit: Payer: Self-pay | Admitting: Cardiovascular Disease

## 2016-06-08 DIAGNOSIS — Z950 Presence of cardiac pacemaker: Secondary | ICD-10-CM

## 2016-06-08 DIAGNOSIS — R001 Bradycardia, unspecified: Secondary | ICD-10-CM

## 2016-07-02 ENCOUNTER — Ambulatory Visit (INDEPENDENT_AMBULATORY_CARE_PROVIDER_SITE_OTHER): Payer: Medicare Other | Admitting: Cardiovascular Disease

## 2016-07-02 ENCOUNTER — Encounter: Payer: Self-pay | Admitting: Cardiovascular Disease

## 2016-07-02 VITALS — BP 121/67 | HR 72 | Ht 67.0 in | Wt 142.5 lb

## 2016-07-02 DIAGNOSIS — E785 Hyperlipidemia, unspecified: Secondary | ICD-10-CM

## 2016-07-02 DIAGNOSIS — I25118 Atherosclerotic heart disease of native coronary artery with other forms of angina pectoris: Secondary | ICD-10-CM

## 2016-07-02 NOTE — Patient Instructions (Signed)
Medication Instructions: Continue same medications.   Labwork: None.   Procedures/Testing: None.   Follow-Up: 1 year with Dr. Dayna Geurts.   Any Additional Special Instructions Will Be Listed Below (If Applicable).     If you need a refill on your cardiac medications before your next appointment, please call your pharmacy.   

## 2016-07-02 NOTE — Progress Notes (Signed)
Cardiology Office Note   Date:  07/02/2016   ID:  [name], DOB 09/25/1927, MRN [id number]  PCP:  Barron Alvine, MD  Cardiologist:   Lorine Bears, MD   Chief Complaint  Patient presents with  . other    1 yr f/u c/o fatigue. Meds reviewed verbally with pt.      History of Present Illness: [name] is a 83 y.o. male who presents for  a follow-up visit  regarding coronary artery disease.  He has known history of coronary artery disease status post CABG in 2001. Most recent cardiac catheterization in 2010 showed patent grafts with 80% ostial left circumflex stenosis. Subsequent nuclear stress test in 2011 showed no evidence of ischemia with normal ejection fraction. He is also status post dual-chamber pacemaker placement monitored by Dr. Graciela Husbands. He has chronic headache Which has improved recently with gabapentin. He has been on Ranexa for stable angina with significant improvement in symptoms.  He has been doing well overall with no recent chest pain. He reports stable exertional dyspnea. He is struggling with arthritis  Past Medical History:  Diagnosis Date  . CAD (coronary artery disease)    s/p CABG in 2001; lexiscan 2011  no ischemia and nl  LV function  . HLD (hyperlipidemia)   . Pacemaker   . Sinus node dysfunction (HCC)    s/p PPM  . Ventricular tachycardia, non-sustained [place])     Past Surgical History:  Procedure Laterality Date  . BLADDER STONE REMOVAL  2005  . CARDIAC CATHETERIZATION  09/2008   patent grafts. 80% proximal stneosis in left circumflex artery (subsequent stress test showed no ischemia)  . cataract surgery     bilateral  . CORONARY ARTERY BYPASS GRAFT  2001   LIMA to LAD, SVG to RCA, SVG to ramus, sequential SVG to  first and second diagonal.  . INSERT / REPLACE / REMOVE PACEMAKER    . PACEMAKER INSERTION       Current Outpatient Prescriptions  Medication Sig Dispense Refill  . aspirin 81 MG tablet Take 81 mg by mouth daily.      Marland Kitchen  atorvastatin (LIPITOR) 10 MG tablet Take 10 mg by mouth daily.    Marland Kitchen gabapentin (NEURONTIN) 600 MG tablet Take by mouth. Takes 300 mg am and 600 mg evening daily.    . metoprolol tartrate (LOPRESSOR) 25 MG tablet Take 1 tablet (25 mg total) by mouth 2 (two) times daily. 90 tablet 0  . Multiple Vitamin (MULTIVITAMIN) tablet Take 1 tablet by mouth daily.      . nitroGLYCERIN (NITROSTAT) 0.4 MG SL tablet Place 0.4 mg under the tongue every 5 (five) minutes as needed for chest pain.     Marland Kitchen omeprazole (PRILOSEC) 20 MG capsule Take 20 mg by mouth daily.     Marland Kitchen RANEXA 500 MG 12 hr tablet TAKE 1 TABLET BY MOUTH TWO  TIMES DAILY 180 tablet 0  . sertraline (ZOLOFT) 50 MG tablet Take 50 mg by mouth daily.    . traZODone (DESYREL) 100 MG tablet Take 200 mg by mouth at bedtime.   0  . vitamin B-12 (CYANOCOBALAMIN) 1000 MCG tablet Take 1,000 mcg by mouth daily.     No current facility-administered medications for this visit.     Allergies:   Patient has no known allergies.    Social History:  The patient  reports that he has never smoked. He has never used smokeless tobacco. He reports that he does not drink  alcohol or use drugs.   Family History:  The patient's family history includes Cancer in his mother; Heart attack in his brother and father; Heart disease in his brother, father, and sister.    ROS:  Please see the history of present illness.   Otherwise, review of systems are positive for none.   All other systems are reviewed and negative.    PHYSICAL EXAM: VS:  BP 121/67 (BP Location: Left Arm, Patient Position: Sitting, Cuff Size: Normal)   Pulse 72   Ht 5\' 7"  (1.702 m)   Wt 142 lb 8 oz (64.6 kg)   BMI 22.32 kg/m  , BMI Body mass index is 22.32 kg/m. GEN: Well nourished, well developed, in no acute distress  HEENT: normal  Neck: no JVD, carotid bruits, or masses Cardiac: RRR; no murmurs, rubs, or gallops,no edema  Respiratory:  clear to auscultation bilaterally, normal work of  breathing GI: soft, nontender, nondistended, + BS MS: no deformity or atrophy  Skin: warm and dry, no rash Neuro:  Strength and sensation are intact Psych: euthymic mood, full affect   EKG:  EKG is ordered today. The ekg ordered today demonstrates atrial paced rhythm with no significant ST or T wave changes.  PACs  Recent Labs: No results found for requested labs within last 8760 hours.    Lipid Panel    Component Value Date/Time   CHOL  09/12/2008 0145    133        ATP III CLASSIFICATION:  <200     mg/dL   Desirable  696-295200-239  mg/dL   Borderline High  >=284>=240    mg/dL   High          TRIG 75 09/12/2008 0145   HDL 43 09/12/2008 0145   CHOLHDL 3.1 09/12/2008 0145   VLDL 15 09/12/2008 0145   LDLCALC  09/12/2008 0145    75        Total Cholesterol/HDL:CHD Risk Coronary Heart Disease Risk Table                     Men   Women  1/2 Average Risk   3.4   3.3  Average Risk       5.0   4.4  2 X Average Risk   9.6   7.1  3 X Average Risk  23.4   11.0        Use the calculated Patient Ratio above and the CHD Risk Table to determine the patient's CHD Risk.        ATP III CLASSIFICATION (LDL):  <100     mg/dL   Optimal  132-440100-129  mg/dL   Near or Above                    Optimal  130-159  mg/dL   Borderline  102-725160-189  mg/dL   High  >366>190     mg/dL   Very High      Wt Readings from Last 3 Encounters:  07/02/16 142 lb 8 oz (64.6 kg)  02/24/16 149 lb 12.8 oz (67.9 kg)  06/24/15 146 lb 6.4 oz (66.4 kg)        ASSESSMENT AND PLAN:  1. Coronary artery disease involving native coronary arteries with stable angina :He has stable angina controlled with Ranexa. No significant change in symptoms over the last year. Continue same treatment.   2. Hyperlipidemia:  The patient has history of myalgia with statins thus he is tolerating  atorvastatin 10 mg daily.  3. Status post dual-chamber pacemaker placement: Followed by Dr. Graciela Husbands.    Disposition:   FU with me in 1  year  Signed,  Lorine Bears, MD  07/02/2016 3:21 PM    [facility]

## 2016-07-27 ENCOUNTER — Other Ambulatory Visit: Payer: Self-pay | Admitting: Cardiovascular Disease

## 2016-07-27 DIAGNOSIS — Z950 Presence of cardiac pacemaker: Secondary | ICD-10-CM

## 2016-07-27 DIAGNOSIS — R001 Bradycardia, unspecified: Secondary | ICD-10-CM

## 2016-08-25 ENCOUNTER — Ambulatory Visit (INDEPENDENT_AMBULATORY_CARE_PROVIDER_SITE_OTHER): Payer: Medicare Other | Admitting: *Deleted

## 2016-08-25 DIAGNOSIS — I495 Sick sinus syndrome: Secondary | ICD-10-CM | POA: Diagnosis not present

## 2016-08-26 NOTE — Progress Notes (Signed)
Remote pacemaker transmission.   

## 2016-08-27 LAB — CUP PACEART REMOTE DEVICE CHECK
Battery Remaining Longevity: 71 mo
Battery Voltage: 2.79 V
Brady Statistic AP VS Percent: 83 %
Date Time Interrogation Session: 20180822153031
Implantable Lead Implant Date: 20100910
Implantable Lead Location: 753860
Lead Channel Pacing Threshold Amplitude: 1.375 V
Lead Channel Pacing Threshold Pulse Width: 0.4 ms
Lead Channel Pacing Threshold Pulse Width: 0.4 ms
Lead Channel Setting Pacing Amplitude: 2 V
Lead Channel Setting Pacing Pulse Width: 0.4 ms
MDC IDC LEAD IMPLANT DT: 20100910
MDC IDC LEAD LOCATION: 753859
MDC IDC MSMT BATTERY IMPEDANCE: 868 Ohm
MDC IDC MSMT LEADCHNL RA IMPEDANCE VALUE: 470 Ohm
MDC IDC MSMT LEADCHNL RA PACING THRESHOLD AMPLITUDE: 0.75 V
MDC IDC MSMT LEADCHNL RV IMPEDANCE VALUE: 678 Ohm
MDC IDC PG IMPLANT DT: 20100910
MDC IDC SET LEADCHNL RV PACING AMPLITUDE: 2.75 V
MDC IDC SET LEADCHNL RV SENSING SENSITIVITY: 5.6 mV
MDC IDC STAT BRADY AP VP PERCENT: 0 %
MDC IDC STAT BRADY AS VP PERCENT: 0 %
MDC IDC STAT BRADY AS VS PERCENT: 16 %

## 2016-09-03 ENCOUNTER — Encounter: Payer: Self-pay | Admitting: Cardiology

## 2016-11-24 ENCOUNTER — Ambulatory Visit (INDEPENDENT_AMBULATORY_CARE_PROVIDER_SITE_OTHER): Payer: Medicare Other | Admitting: *Deleted

## 2016-11-24 DIAGNOSIS — I495 Sick sinus syndrome: Secondary | ICD-10-CM | POA: Diagnosis not present

## 2016-11-24 NOTE — Progress Notes (Signed)
Remote pacemaker transmission.   

## 2016-12-03 ENCOUNTER — Encounter: Payer: Self-pay | Admitting: Cardiology

## 2016-12-03 NOTE — Progress Notes (Signed)
Letter  

## 2016-12-14 LAB — CUP PACEART REMOTE DEVICE CHECK
Battery Impedance: 945 Ohm
Brady Statistic AP VP Percent: 0 %
Brady Statistic AP VS Percent: 83 %
Brady Statistic AS VP Percent: 0 %
Brady Statistic AS VS Percent: 17 %
Implantable Lead Implant Date: 20100910
Implantable Lead Implant Date: 20100910
Implantable Lead Location: 753860
Implantable Lead Model: 5076
Implantable Lead Model: 5076
Lead Channel Impedance Value: 482 Ohm
Lead Channel Impedance Value: 749 Ohm
Lead Channel Pacing Threshold Amplitude: 0.75 V
Lead Channel Pacing Threshold Amplitude: 1.25 V
Lead Channel Pacing Threshold Pulse Width: 0.4 ms
Lead Channel Setting Sensing Sensitivity: 5.6 mV
MDC IDC LEAD LOCATION: 753859
MDC IDC MSMT BATTERY REMAINING LONGEVITY: 64 mo
MDC IDC MSMT BATTERY VOLTAGE: 2.79 V
MDC IDC MSMT LEADCHNL RA PACING THRESHOLD PULSEWIDTH: 0.4 ms
MDC IDC PG IMPLANT DT: 20100910
MDC IDC SESS DTM: 20181121143519
MDC IDC SET LEADCHNL RA PACING AMPLITUDE: 2 V
MDC IDC SET LEADCHNL RV PACING AMPLITUDE: 2.5 V
MDC IDC SET LEADCHNL RV PACING PULSEWIDTH: 0.4 ms

## 2017-01-07 DIAGNOSIS — Z01818 Encounter for other preprocedural examination: Secondary | ICD-10-CM

## 2017-02-23 ENCOUNTER — Ambulatory Visit (INDEPENDENT_AMBULATORY_CARE_PROVIDER_SITE_OTHER): Payer: Medicare Other | Admitting: *Deleted

## 2017-02-23 DIAGNOSIS — I495 Sick sinus syndrome: Secondary | ICD-10-CM

## 2017-02-24 ENCOUNTER — Encounter: Payer: Self-pay | Admitting: Cardiology

## 2017-02-24 NOTE — Progress Notes (Signed)
Remote pacemaker transmission.   

## 2017-02-28 LAB — CUP PACEART REMOTE DEVICE CHECK
Battery Remaining Longevity: 62 mo
Battery Voltage: 2.78 V
Brady Statistic AP VP Percent: 1 %
Brady Statistic AS VS Percent: 19 %
Implantable Lead Implant Date: 20100910
Implantable Lead Location: 753860
Implantable Lead Model: 5076
Implantable Pulse Generator Implant Date: 20100910
Lead Channel Impedance Value: 475 Ohm
Lead Channel Impedance Value: 749 Ohm
Lead Channel Pacing Threshold Amplitude: 1.25 V
Lead Channel Pacing Threshold Pulse Width: 0.4 ms
Lead Channel Setting Pacing Amplitude: 2 V
Lead Channel Setting Pacing Pulse Width: 0.4 ms
Lead Channel Setting Sensing Sensitivity: 5.6 mV
MDC IDC LEAD IMPLANT DT: 20100910
MDC IDC LEAD LOCATION: 753859
MDC IDC MSMT BATTERY IMPEDANCE: 996 Ohm
MDC IDC MSMT LEADCHNL RA PACING THRESHOLD AMPLITUDE: 0.75 V
MDC IDC MSMT LEADCHNL RA PACING THRESHOLD PULSEWIDTH: 0.4 ms
MDC IDC SESS DTM: 20190220173409
MDC IDC SET LEADCHNL RV PACING AMPLITUDE: 2.5 V
MDC IDC STAT BRADY AP VS PERCENT: 81 %
MDC IDC STAT BRADY AS VP PERCENT: 0 %

## 2017-03-01 ENCOUNTER — Ambulatory Visit: Payer: Medicare Other | Admitting: Internal Medicine

## 2017-03-01 ENCOUNTER — Encounter: Payer: Self-pay | Admitting: Internal Medicine

## 2017-03-01 VITALS — BP 110/60 | HR 80 | Ht 67.0 in | Wt 144.0 lb

## 2017-03-01 DIAGNOSIS — I259 Chronic ischemic heart disease, unspecified: Secondary | ICD-10-CM

## 2017-03-01 DIAGNOSIS — Z950 Presence of cardiac pacemaker: Secondary | ICD-10-CM | POA: Diagnosis not present

## 2017-03-01 DIAGNOSIS — R001 Bradycardia, unspecified: Secondary | ICD-10-CM

## 2017-03-01 NOTE — Progress Notes (Signed)
Patient Care Team: Barron AlvineGibson, David, MD as PCP - General (Family Medicine)   HPI  [name] is a 84 y.o. male Seen in followup for a pacemaker implanted September 2010 for symptomatic bradycardia.   He has a history of coronary artery disease with prior bypass surgery and PCI of the circumflex lesion September 2010. Because of symptoms of exertional shortness of breath and chest pain that was somewhat atypical he underwent a lexiscan stress testing August 2011 demonstrating normal left ventricular function and no ischemia .  Because of ongoing chest pain.  He was treated with ranolazine.   He continues with chest pains.  They are mostly when he works on his left side.  Other than that he has no chest pain complaints.  Denies shortness of breath.  No edema.  His wife says he is doing great.  Past Medical History:  Diagnosis Date  . CAD (coronary artery disease)    s/p CABG in 2001; lexiscan 2011  no ischemia and nl  LV function  . HLD (hyperlipidemia)   . Pacemaker   . Sinus node dysfunction (HCC)    s/p PPM  . Ventricular tachycardia, non-sustained [facility](HCC)     Past Surgical History:  Procedure Laterality Date  . BLADDER STONE REMOVAL  2005  . CARDIAC CATHETERIZATION  09/2008   patent grafts. 80% proximal stneosis in left circumflex artery (subsequent stress test showed no ischemia)  . cataract surgery     bilateral  . CORONARY ARTERY BYPASS GRAFT  2001   LIMA to [name], SVG to RCA, SVG to ramus, sequential SVG to  first and second diagonal.  . INSERT / REPLACE / REMOVE PACEMAKER    . PACEMAKER INSERTION      Current Outpatient Medications  Medication Sig Dispense Refill  . aspirin 81 MG tablet Take 81 mg by mouth daily.      Marland Kitchen. gabapentin (NEURONTIN) 600 MG tablet Take by mouth. Takes 300 mg am and 600 mg evening daily.    . metoprolol tartrate (LOPRESSOR) 25 MG tablet Take 1 tablet (25 mg total) by mouth 2 (two) times daily. 90 tablet 0  . Multiple Vitamin  (MULTIVITAMIN) tablet Take 1 tablet by mouth daily.      . nitroGLYCERIN (NITROSTAT) 0.4 MG SL tablet Place 0.4 mg under the tongue every 5 (five) minutes as needed for chest pain.     Marland Kitchen. omeprazole (PRILOSEC) 20 MG capsule Take 20 mg by mouth daily.     Marland Kitchen. RANEXA 500 MG 12 hr tablet TAKE 1 TABLET BY MOUTH TWO  TIMES DAILY 180 tablet 3  . sertraline (ZOLOFT) 50 MG tablet Take 50 mg by mouth daily.    . traZODone (DESYREL) 100 MG tablet Take 200 mg by mouth at bedtime.   0  . vitamin B-12 (CYANOCOBALAMIN) 1000 MCG tablet Take 1,000 mcg by mouth daily.     No current facility-administered medications for this visit.     No Known Allergies  Review of Systems negative except from HPI and PMH  Physical Exam BP 110/60 (BP Location: Right Arm, Patient Position: Sitting, Cuff Size: Normal)   Pulse 80   Ht 5\' 7"  (1.702 m)   Wt 144 lb (65.3 kg)   BMI 22.55 kg/m  Well developed and nourished in no acute distress HENT normal Neck supple with JVP-flat Clear Regular rate and rhythm, no murmurs or gallops Abd-soft with active BS No Clubbing cyanosis edema Skin-warm and dry A & Oriented  Grossly normal sensory and motor function    ECG atrial pacing at 80 Intervals 20/08/41  Assessment and  Plan  Ischemic heart disease  With prior bypass surgery  Sinus bradycardia  Implantable pacemaker-[employer] .     Headaches -chronic    Chest pains-atypical  Without symptoms of ischemia  He has stopped his statin because of musculoskeletal discomfort.  He feels much better off of it.  84 years old, is hard to be dogmatic about its resumption not withstanding his underlying ischemic heart disease  Device function is normal; no reprogramming

## 2017-03-01 NOTE — Patient Instructions (Addendum)
Medication Instructions: - Your physician recommends that you continue on your current medications as directed. Please refer to the Current Medication list given to you today.  Labwork: - none ordered  Procedures/Testing: - none ordered  Follow-Up: - Remote monitoring is used to monitor your Pacemaker of ICD from home. This monitoring reduces the number of office visits required to check your device to one time per year. It allows us to keep an eye on the functioning of your device to ensure it is working properly. You are scheduled for a device check from home on 05/25/17. You may send your transmission at any time that day. If you have a wireless device, the transmission will be sent automatically. After your physician reviews your transmission, you will receive a postcard with your next transmission date.   - Your physician wants you to follow-up in: 1 year with Dr. Graciela HusbandsKlein. You will receive a reminder letter in the mail two months in advance. If you don't receive a letter, please call our office to schedule the follow-up appointment.   Any Additional Special Instructions Will Be Listed Below (If Applicable).     If you need a refill on your cardiac medications before your next appointment, please call your pharmacy.

## 2017-05-25 ENCOUNTER — Ambulatory Visit (INDEPENDENT_AMBULATORY_CARE_PROVIDER_SITE_OTHER): Payer: Medicare Other | Admitting: *Deleted

## 2017-05-25 ENCOUNTER — Telehealth: Payer: Self-pay | Admitting: Cardiology

## 2017-05-25 DIAGNOSIS — I495 Sick sinus syndrome: Secondary | ICD-10-CM

## 2017-05-25 NOTE — Telephone Encounter (Signed)
Spoke with pt and reminded pt of remote transmission that is due today. Pt verbalized understanding.   

## 2017-05-26 ENCOUNTER — Encounter: Payer: Self-pay | Admitting: Cardiology

## 2017-05-26 NOTE — Progress Notes (Signed)
Remote pacemaker transmission.   

## 2017-06-11 LAB — CUP PACEART REMOTE DEVICE CHECK
Battery Impedance: 1100 Ohm
Battery Remaining Longevity: 62 mo
Battery Voltage: 2.78 V
Brady Statistic AP VP Percent: 0 %
Brady Statistic AP VS Percent: 73 %
Brady Statistic AS VP Percent: 1 %
Brady Statistic AS VS Percent: 26 %
Date Time Interrogation Session: 20190522194119
Implantable Lead Implant Date: 20100910
Implantable Lead Implant Date: 20100910
Implantable Lead Location: 753859
Implantable Lead Location: 753860
Implantable Lead Model: 5076
Implantable Lead Model: 5076
Implantable Pulse Generator Implant Date: 20100910
Lead Channel Impedance Value: 457 Ohm
Lead Channel Impedance Value: 654 Ohm
Lead Channel Pacing Threshold Amplitude: 0.75 V
Lead Channel Pacing Threshold Amplitude: 1.375 V
Lead Channel Pacing Threshold Pulse Width: 0.4 ms
Lead Channel Pacing Threshold Pulse Width: 0.4 ms
Lead Channel Setting Pacing Amplitude: 2 V
Lead Channel Setting Pacing Amplitude: 2.75 V
Lead Channel Setting Pacing Pulse Width: 0.4 ms
Lead Channel Setting Sensing Sensitivity: 5.6 mV

## 2017-07-25 ENCOUNTER — Other Ambulatory Visit: Payer: Self-pay | Admitting: Cardiovascular Disease

## 2017-07-25 DIAGNOSIS — R001 Bradycardia, unspecified: Secondary | ICD-10-CM

## 2017-07-25 DIAGNOSIS — Z950 Presence of cardiac pacemaker: Secondary | ICD-10-CM

## 2017-07-26 ENCOUNTER — Telehealth: Payer: Self-pay | Admitting: Cardiovascular Disease

## 2017-07-26 NOTE — Telephone Encounter (Signed)
-----   Message from Margrett RudBrittany N Slayton, [place] sent at 07/25/2017  9:49 AM EDT ----- Regarding: Patient needs an appointment  Can you please try to schedule an appointment with patient.   Thank you!

## 2017-07-26 NOTE — Telephone Encounter (Signed)
Lmov for patient to call back and schedule this °

## 2017-07-27 NOTE — Telephone Encounter (Signed)
Lmov for patient to call back and schedule this

## 2017-07-28 NOTE — Telephone Encounter (Signed)
Pt is coming 09/14/17 to see Ward GivensChris Berge  Will need refills until then

## 2017-08-24 ENCOUNTER — Telehealth: Payer: Self-pay | Admitting: Cardiology

## 2017-08-24 ENCOUNTER — Ambulatory Visit (INDEPENDENT_AMBULATORY_CARE_PROVIDER_SITE_OTHER): Payer: Medicare Other | Admitting: *Deleted

## 2017-08-24 DIAGNOSIS — I495 Sick sinus syndrome: Secondary | ICD-10-CM

## 2017-08-24 DIAGNOSIS — R001 Bradycardia, unspecified: Secondary | ICD-10-CM

## 2017-08-24 NOTE — Progress Notes (Signed)
Remote pacemaker transmission.   

## 2017-08-24 NOTE — Telephone Encounter (Signed)
Confirmed remote transmission w/ pt wife.   

## 2017-08-26 ENCOUNTER — Encounter: Payer: Self-pay | Admitting: Cardiology

## 2017-09-14 ENCOUNTER — Ambulatory Visit: Payer: Medicare Other | Admitting: Nurse Practitioner

## 2017-09-20 ENCOUNTER — Ambulatory Visit: Payer: Medicare Other | Admitting: Cardiovascular Disease

## 2017-09-20 ENCOUNTER — Encounter: Payer: Self-pay | Admitting: Cardiovascular Disease

## 2017-09-20 VITALS — BP 100/70 | HR 82 | Ht 67.0 in | Wt 146.5 lb

## 2017-09-20 DIAGNOSIS — I495 Sick sinus syndrome: Secondary | ICD-10-CM

## 2017-09-20 DIAGNOSIS — I25118 Atherosclerotic heart disease of native coronary artery with other forms of angina pectoris: Secondary | ICD-10-CM | POA: Diagnosis not present

## 2017-09-20 DIAGNOSIS — E785 Hyperlipidemia, unspecified: Secondary | ICD-10-CM

## 2017-09-20 NOTE — Patient Instructions (Signed)
Medication Instructions: Your physician recommends that you continue on your current medications as directed. Please refer to the Current Medication list given to you today.  If you need a refill on your cardiac medications before your next appointment, please call your pharmacy.   Follow-Up: Your physician wants you to follow-up in 12 months with Dr. Arida. You will receive a reminder letter in the mail two months in advance. If you don't receive a letter, please call our office at [phone] to schedule this follow-up appointment.  Thank you for choosing [facility] [place]!    

## 2017-09-20 NOTE — Progress Notes (Signed)
Cardiology Office Note   Date:  09/20/2017   ID:  [name] ReiningClyde A Zeiders, DOB 04/09/1927, MRN [id number]  PCP:  Barron AlvineGibson, David, MD  Cardiologist:   Lorine BearsMuhammad Arida, MD   Chief Complaint  Patient presents with  . other    12 month f/u. Meds reviewed verbally with pt.      History of Present Illness: [name] is a 84 y.o. male who presents for  a follow-up visit  regarding coronary artery disease.  He has known history of coronary artery disease status post CABG in 2001. Most recent cardiac catheterization in 2010 showed patent grafts with 80% ostial left circumflex stenosis. Subsequent nuclear stress test in 2011 showed no evidence of ischemia with normal ejection fraction. He is also status post dual-chamber pacemaker placement monitored by Dr. Graciela HusbandsKlein. He has chronic headache.  He reports rare episodes of chest pain overall and stable exertional dyspnea.  His blood pressure is on the low side but he denies dizziness.  No syncope.  His biggest issue currently seems to be arthritis. He stopped taking atorvastatin due to myalgia.  Past Medical History:  Diagnosis Date  . CAD (coronary artery disease)    s/p CABG in 2001; lexiscan 2011  no ischemia and nl  LV function  . HLD (hyperlipidemia)   . Pacemaker   . Sinus node dysfunction (HCC)    s/p PPM  . Ventricular tachycardia, non-sustained [facility](HCC)     Past Surgical History:  Procedure Laterality Date  . BLADDER STONE REMOVAL  2005  . CARDIAC CATHETERIZATION  09/2008   patent grafts. 80% proximal stneosis in left circumflex artery (subsequent stress test showed no ischemia)  . cataract surgery     bilateral  . CORONARY ARTERY BYPASS GRAFT  2001   LIMA to LAD, SVG to RCA, SVG to ramus, sequential SVG to  first and second diagonal.  . INSERT / REPLACE / REMOVE PACEMAKER    . PACEMAKER INSERTION       Current Outpatient Medications  Medication Sig Dispense Refill  . aspirin 81 MG tablet Take 81 mg by mouth daily.      Marland Kitchen.  gabapentin (NEURONTIN) 600 MG tablet Take 600 mg by mouth 2 (two) times daily.     . metoprolol tartrate (LOPRESSOR) 25 MG tablet Take 1 tablet (25 mg total) by mouth 2 (two) times daily. 90 tablet 0  . Multiple Vitamin (MULTIVITAMIN) tablet Take 1 tablet by mouth daily.      . nitroGLYCERIN (NITROSTAT) 0.4 MG SL tablet Place 0.4 mg under the tongue every 5 (five) minutes as needed for chest pain.     . NON FORMULARY Hemo oil prn.    . omeprazole (PRILOSEC) 20 MG capsule Take 20 mg by mouth daily.     . ranolazine (RANEXA) 500 MG 12 hr tablet TAKE 1 TABLET BY MOUTH TWO  TIMES DAILY 180 tablet 0  . sertraline (ZOLOFT) 50 MG tablet Take 50 mg by mouth daily.    . traZODone (DESYREL) 100 MG tablet Take 200 mg by mouth at bedtime.   0  . Turmeric 500 MG CAPS Take by mouth daily.    . vitamin B-12 (CYANOCOBALAMIN) 1000 MCG tablet Take 1,000 mcg by mouth daily.     No current facility-administered medications for this visit.     Allergies:   Patient has no known allergies.    Social History:  The patient  reports that he has never smoked. He has never used smokeless tobacco.  He reports that he does not drink alcohol or use drugs.   Family History:  The patient's family history includes Cancer in his mother; Heart attack in his brother, father, and unknown relative; Heart disease in his brother, father, and sister.    ROS:  Please see the history of present illness.   Otherwise, review of systems are positive for none.   All other systems are reviewed and negative.    PHYSICAL EXAM: VS:  BP 100/70 (BP Location: Left Arm, Patient Position: Sitting, Cuff Size: Normal)   Pulse 82   Ht 5\' 7"  (1.702 m)   Wt 146 lb 8 oz (66.5 kg)   BMI 22.95 kg/m  , BMI Body mass index is 22.95 kg/m. GEN: Well nourished, well developed, in no acute distress  HEENT: normal  Neck: no JVD, carotid bruits, or masses Cardiac: RRR; no murmurs, rubs, or gallops,no edema  Respiratory:  clear to auscultation  bilaterally, normal work of breathing GI: soft, nontender, nondistended, + BS MS: no deformity or atrophy  Skin: warm and dry, no rash Neuro:  Strength and sensation are intact Psych: euthymic mood, full affect   EKG:  EKG is ordered today. The ekg ordered today demonstrates atrial paced rhythm with no significant ST or T wave changes.  PACs  Recent Labs: No results found for requested labs within last 8760 hours.    Lipid Panel    Component Value Date/Time   CHOL  09/12/2008 0145    133        ATP III CLASSIFICATION:  <200     mg/dL   Desirable  132-440  mg/dL   Borderline High  >=102    mg/dL   High          TRIG 75 09/12/2008 0145   HDL 43 09/12/2008 0145   CHOLHDL 3.1 09/12/2008 0145   VLDL 15 09/12/2008 0145   LDLCALC  09/12/2008 0145    75        Total Cholesterol/HDL:CHD Risk Coronary Heart Disease Risk Table                     Men   Women  1/2 Average Risk   3.4   3.3  Average Risk       5.0   4.4  2 X Average Risk   9.6   7.1  3 X Average Risk  23.4   11.0        Use the calculated Patient Ratio above and the CHD Risk Table to determine the patient's CHD Risk.        ATP III CLASSIFICATION (LDL):  <100     mg/dL   Optimal  725-366  mg/dL   Near or Above                    Optimal  130-159  mg/dL   Borderline  440-347  mg/dL   High  >425     mg/dL   Very High      Wt Readings from Last 3 Encounters:  09/20/17 146 lb 8 oz (66.5 kg)  03/01/17 144 lb (65.3 kg)  07/02/16 142 lb 8 oz (64.6 kg)        ASSESSMENT AND PLAN:  1. Coronary artery disease involving native coronary arteries with stable angina :He has stable angina controlled with Ranexa and metoprolol. No significant change in symptoms over the last year. Continue same treatment.   2. Hyperlipidemia: The patient is intolerant  to statins and most recently stopped atorvastatin.  I discussed with him other options such as Zetia but he is not interested.  3. Status post dual-chamber  pacemaker placement: Followed by Dr. Graciela Husbands.    Disposition:   FU with me in 1 year  Signed,  Lorine Bears, MD  09/20/2017 2:35 PM    [facility]

## 2017-09-27 LAB — CUP PACEART REMOTE DEVICE CHECK
Battery Remaining Longevity: 54 mo
Battery Voltage: 2.78 V
Brady Statistic AP VP Percent: 0 %
Brady Statistic AS VP Percent: 0 %
Implantable Lead Implant Date: 20100910
Implantable Lead Model: 5076
Implantable Lead Model: 5076
Implantable Pulse Generator Implant Date: 20100910
Lead Channel Impedance Value: 476 Ohm
Lead Channel Pacing Threshold Amplitude: 0.75 V
Lead Channel Pacing Threshold Pulse Width: 0.4 ms
Lead Channel Setting Pacing Amplitude: 2 V
Lead Channel Setting Pacing Pulse Width: 0.4 ms
Lead Channel Setting Sensing Sensitivity: 5.6 mV
MDC IDC LEAD IMPLANT DT: 20100910
MDC IDC LEAD LOCATION: 753859
MDC IDC LEAD LOCATION: 753860
MDC IDC MSMT BATTERY IMPEDANCE: 1232 Ohm
MDC IDC MSMT LEADCHNL RA PACING THRESHOLD PULSEWIDTH: 0.4 ms
MDC IDC MSMT LEADCHNL RV IMPEDANCE VALUE: 718 Ohm
MDC IDC MSMT LEADCHNL RV PACING THRESHOLD AMPLITUDE: 1.25 V
MDC IDC SESS DTM: 20190821162620
MDC IDC SET LEADCHNL RV PACING AMPLITUDE: 2.5 V
MDC IDC STAT BRADY AP VS PERCENT: 78 %
MDC IDC STAT BRADY AS VS PERCENT: 22 %

## 2017-09-28 ENCOUNTER — Other Ambulatory Visit: Payer: Self-pay | Admitting: Cardiovascular Disease

## 2017-09-28 DIAGNOSIS — Z950 Presence of cardiac pacemaker: Secondary | ICD-10-CM

## 2017-09-28 DIAGNOSIS — R001 Bradycardia, unspecified: Secondary | ICD-10-CM

## 2017-11-24 ENCOUNTER — Encounter: Payer: Self-pay | Admitting: Cardiology

## 2017-11-25 ENCOUNTER — Ambulatory Visit (INDEPENDENT_AMBULATORY_CARE_PROVIDER_SITE_OTHER): Payer: Medicare Other

## 2017-11-25 DIAGNOSIS — I495 Sick sinus syndrome: Secondary | ICD-10-CM | POA: Diagnosis not present

## 2017-11-28 ENCOUNTER — Encounter: Payer: Self-pay | Admitting: Cardiology

## 2017-11-28 NOTE — Progress Notes (Signed)
Remote pacemaker transmission.   

## 2017-11-30 DIAGNOSIS — J189 Pneumonia, unspecified organism: Secondary | ICD-10-CM

## 2017-11-30 HISTORY — DX: Pneumonia, unspecified organism: J18.9

## 2018-01-20 LAB — CUP PACEART REMOTE DEVICE CHECK
Brady Statistic AP VS Percent: 79 %
Brady Statistic AS VP Percent: 0 %
Date Time Interrogation Session: 20191122173629
Implantable Lead Implant Date: 20100910
Implantable Lead Location: 753860
Implantable Lead Model: 5076
Lead Channel Pacing Threshold Amplitude: 0.75 V
Lead Channel Pacing Threshold Amplitude: 1.5 V
Lead Channel Pacing Threshold Pulse Width: 0.4 ms
Lead Channel Pacing Threshold Pulse Width: 0.4 ms
Lead Channel Setting Pacing Amplitude: 2 V
MDC IDC LEAD IMPLANT DT: 20100910
MDC IDC LEAD LOCATION: 753859
MDC IDC MSMT BATTERY IMPEDANCE: 1340 Ohm
MDC IDC MSMT BATTERY REMAINING LONGEVITY: 54 mo
MDC IDC MSMT BATTERY VOLTAGE: 2.78 V
MDC IDC MSMT LEADCHNL RA IMPEDANCE VALUE: 483 Ohm
MDC IDC MSMT LEADCHNL RV IMPEDANCE VALUE: 678 Ohm
MDC IDC PG IMPLANT DT: 20100910
MDC IDC SET LEADCHNL RV PACING AMPLITUDE: 3 V
MDC IDC SET LEADCHNL RV PACING PULSEWIDTH: 0.4 ms
MDC IDC SET LEADCHNL RV SENSING SENSITIVITY: 5.6 mV
MDC IDC STAT BRADY AP VP PERCENT: 1 %
MDC IDC STAT BRADY AS VS PERCENT: 20 %

## 2018-02-27 ENCOUNTER — Ambulatory Visit (INDEPENDENT_AMBULATORY_CARE_PROVIDER_SITE_OTHER): Payer: Medicare Other | Admitting: *Deleted

## 2018-02-27 DIAGNOSIS — I495 Sick sinus syndrome: Secondary | ICD-10-CM | POA: Diagnosis not present

## 2018-02-27 DIAGNOSIS — R001 Bradycardia, unspecified: Secondary | ICD-10-CM

## 2018-02-28 LAB — CUP PACEART REMOTE DEVICE CHECK
Battery Remaining Longevity: 51 mo
Battery Voltage: 2.78 V
Brady Statistic AP VP Percent: 1 %
Brady Statistic AS VP Percent: 0 %
Implantable Lead Implant Date: 20100910
Implantable Lead Model: 5076
Lead Channel Impedance Value: 470 Ohm
Lead Channel Pacing Threshold Amplitude: 0.75 V
Lead Channel Pacing Threshold Pulse Width: 0.4 ms
Lead Channel Setting Pacing Amplitude: 2 V
Lead Channel Setting Pacing Pulse Width: 0.4 ms
MDC IDC LEAD IMPLANT DT: 20100910
MDC IDC LEAD LOCATION: 753859
MDC IDC LEAD LOCATION: 753860
MDC IDC MSMT BATTERY IMPEDANCE: 1421 Ohm
MDC IDC MSMT LEADCHNL RV IMPEDANCE VALUE: 694 Ohm
MDC IDC MSMT LEADCHNL RV PACING THRESHOLD AMPLITUDE: 1.375 V
MDC IDC MSMT LEADCHNL RV PACING THRESHOLD PULSEWIDTH: 0.4 ms
MDC IDC PG IMPLANT DT: 20100910
MDC IDC SESS DTM: 20200224180131
MDC IDC SET LEADCHNL RV PACING AMPLITUDE: 2.75 V
MDC IDC SET LEADCHNL RV SENSING SENSITIVITY: 5.6 mV
MDC IDC STAT BRADY AP VS PERCENT: 78 %
MDC IDC STAT BRADY AS VS PERCENT: 20 %

## 2018-03-07 NOTE — Progress Notes (Signed)
Remote pacemaker transmission.   

## 2018-03-13 ENCOUNTER — Telehealth: Payer: Self-pay | Admitting: Cardiovascular Disease

## 2018-03-13 NOTE — Telephone Encounter (Signed)
Spoke with the pt and his wife. The pt called to report elevated BP.  The patient has checked his BP several times today, almost hourly. BP readings provided: 151/82 147/82 148/71 Pt wife sts that the BP reading below of 188/99 is not accurate. Discourage the pt from checking his BP hourly, adv pt that 1-2 times daily is usually recommended. Pt also reports episodes of chest discomfort from time. He has had angina for years and is currently taking Ranexa 500mg  bid. His angina is normally brought on by activity. He has noted that recently the discomfort can occur while sitting and watching TV. It discomfort is short in duration. The pt is requesting to be seen by Dr.Arida asap. Adv pt that Dr.Arida availability in the office is limited. Offered pt an appt agin with an APP and he is agreeable. appt scheduled with Eula Listen, PA on 03/15/18 @ 8:30am. Adv pt to got to the ED is chest pain worsen or increases in frequency or duration.

## 2018-03-13 NOTE — Telephone Encounter (Addendum)
Pt c/o BP issue: STAT if pt c/o blurred vision, one-sided weakness or slurred speech  1. What are your last 5 BP readings? 188/99  152/80  146/86   2. Are you having any other symptoms (ex. Dizziness, headache, blurred vision, passed out)? Headache last 8 yr some L side chest pressure feels uneasy  3. What is your BP issue?  Elevated bp patient wants to be seen asap for eval declined  To be seen by APP declined to be scheduled in April with Arida (next available)

## 2018-03-14 NOTE — Progress Notes (Signed)
Cardiology Office Note Date:  03/15/2018  Patient ID:  [name], [name] [birth year], MRN [id number] PCP:  Barron Alvine, MD  Cardiologist:  Dr. Kirke Corin, MD Electrophysiologist: Dr. Graciela Husbands, MD    Chief Complaint: Elevated BP and chest pain  History of Present Illness: [name] is a 85 y.o. male with history of CAD s/p CABG in 2001, sinus node dysfunction s/p dual-chamber PPM followed by Dr. Graciela Husbands, NSVT, HLD intolerant to Lipitor secondary to myalgias, and chronic headache who presents for elevated BP and chest pain.  His most recent cath in 2010 showed patent grafts with 80% ostial LCx stenosis without PCI being performed as it was felt his symptoms were more related to his EP issue and PPM was advised. Subsequent Myoview in 2011 showed no evidence of ischemia with a normal EF. He was last seen by Dr. Kirke Corin in 09/2017 noting rare episodes of chest pain and stable exertional dyspnea. No changes given stable symptoms.   Patient called on 03/13/2018 noting BP readings in the 140-150 systolic range and had been checking his PB quite often. He also noted chest discomfort at rest.   Patient comes in accompanied by his wife today.  He has reported elevated BP readings over the past 3 to 4 days with systolics in the 140-150 range with a reported max BP of 188/99 though patient's wife has subsequently discounted this blood pressure saying it was erroneous.  Patient has been checking his blood pressure hourly over the past several days.  He reported a home blood pressure this morning of 115 systolic, which was obtained after eating his morning [employer] which he eats 7 days/week.  He does not drink hardly any water throughout the day.  He is compliant with all medications.  He also notes stable, chronic exertional shortness of breath and chest pain which is unchanged from prior.  Lastly, he notes he has had a headache for the past 8 years without interruption with prior work-up being unrevealing.  He is  quite anxious.   Past Medical History:  Diagnosis Date  . CAD (coronary artery disease)    s/p CABG in 2001; lexiscan 2011  no ischemia and nl  LV function  . HLD (hyperlipidemia)   . Pacemaker   . Sinus node dysfunction (HCC)    s/p PPM  . Ventricular tachycardia, non-sustained [facility])     Past Surgical History:  Procedure Laterality Date  . BLADDER STONE REMOVAL  2005  . CARDIAC CATHETERIZATION  09/2008   patent grafts. 80% proximal stneosis in left circumflex artery (subsequent stress test showed no ischemia)  . cataract surgery     bilateral  . CORONARY ARTERY BYPASS GRAFT  2001   LIMA to LAD, SVG to RCA, SVG to ramus, sequential SVG to  first and second diagonal.  . INSERT / REPLACE / REMOVE PACEMAKER    . PACEMAKER INSERTION      Current Meds  Medication Sig  . aspirin 81 MG tablet Take 81 mg by mouth daily.    Marland Kitchen gabapentin (NEURONTIN) 600 MG tablet Take 600 mg by mouth 2 (two) times daily.   . metoprolol tartrate (LOPRESSOR) 25 MG tablet Take 1 tablet (25 mg total) by mouth 2 (two) times daily.  . Multiple Vitamin (MULTIVITAMIN) tablet Take 1 tablet by mouth daily.    . nitroGLYCERIN (NITROSTAT) 0.4 MG SL tablet Place 0.4 mg under the tongue every 5 (five) minutes as needed for chest pain.   . NON FORMULARY Hemo oil  prn.  . omeprazole (PRILOSEC) 20 MG capsule Take 20 mg by mouth daily.   Marland Kitchen. RANEXA 500 MG 12 hr tablet TAKE 1 TABLET BY MOUTH TWO  TIMES DAILY  . sertraline (ZOLOFT) 50 MG tablet Take 50 mg by mouth daily.  . traZODone (DESYREL) 100 MG tablet Take 200 mg by mouth at bedtime.   . Turmeric 500 MG CAPS Take by mouth daily.  . vitamin B-12 (CYANOCOBALAMIN) 1000 MCG tablet Take 1,000 mcg by mouth daily.    Allergies:   Patient has no known allergies.   Social History:  The patient  reports that he has never smoked. He has never used smokeless tobacco. He reports that he does not drink alcohol or use drugs.   Family History:  The patient's family history  includes Cancer in his mother; Heart attack in his brother, father, and unknown relative; Heart disease in his brother, father, and sister.  ROS:   Review of Systems  Constitutional: Positive for malaise/fatigue. Negative for chills, diaphoresis, fever and weight loss.  HENT: Negative for congestion.   Eyes: Negative for discharge and redness.  Respiratory: Positive for shortness of breath. Negative for cough, hemoptysis, sputum production and wheezing.   Cardiovascular: Positive for chest pain. Negative for palpitations, orthopnea, claudication, leg swelling and PND.  Gastrointestinal: Negative for abdominal pain, blood in stool, heartburn, melena, nausea and vomiting.  Genitourinary: Negative for hematuria.  Musculoskeletal: Negative for falls and myalgias.  Skin: Negative for rash.  Neurological: Positive for headaches. Negative for dizziness, tingling, tremors, sensory change, speech change, focal weakness, loss of consciousness and weakness.  Endo/Heme/Allergies: Does not bruise/bleed easily.  Psychiatric/Behavioral: Negative for substance abuse. The patient is nervous/anxious.   All other systems reviewed and are negative.    PHYSICAL EXAM:  VS:  BP 106/60 (BP Location: Left Arm, Patient Position: Sitting, Cuff Size: Normal)   Pulse 71   Ht 5\' 7"  (1.702 m)   Wt 146 lb (66.2 kg)   BMI 22.87 kg/m  BMI: Body mass index is 22.87 kg/m.  Physical Exam  Constitutional: He is oriented to person, place, and time. He appears well-developed and well-nourished.  HENT:  Head: Normocephalic and atraumatic.  Eyes: Right eye exhibits no discharge. Left eye exhibits no discharge.  Neck: Normal range of motion. No JVD present.  Cardiovascular: Normal rate, regular rhythm, S1 normal, S2 normal and normal heart sounds. Exam reveals no distant heart sounds, no friction rub, no midsystolic click and no opening snap.  No murmur heard. Pulmonary/Chest: Effort normal and breath sounds normal. No  respiratory distress. He has no decreased breath sounds. He has no wheezes. He has no rales. He exhibits no tenderness.  Abdominal: Soft. He exhibits no distension. There is no abdominal tenderness.  Musculoskeletal:        General: No edema.  Neurological: He is alert and oriented to person, place, and time.  Skin: Skin is warm and dry. No cyanosis. Nails show no clubbing.  Psychiatric: His speech is normal and behavior is normal. Judgment and thought content normal. His mood appears anxious.  Anxious appearing     EKG:  Was ordered and interpreted by me today. Shows paced, 71 bpm  Recent Labs: No results found for requested labs within last 8760 hours.  No results found for requested labs within last 8760 hours.   CrCl cannot be calculated (Patient's most recent lab result is older than the maximum 21 days allowed.).   Wt Readings from Last 3 Encounters:  03/15/18  146 lb (66.2 kg)  09/20/17 146 lb 8 oz (66.5 kg)  03/01/17 144 lb (65.3 kg)     Other studies reviewed: Additional studies/records reviewed today include: summarized above  ASSESSMENT AND PLAN:  1. CAD s/p CABG with chronic, stable angina: Currently without chest pain.  Continue current medications including aspirin, Lopressor, and Ranexa.  Patient declines titration of Ranexa today.  No plans for ischemic evaluation at this time given stable symptoms.  2. Sinus node dysfunction: Status post PPM followed by EP.  3. HTN: Difficult to evaluate what his blood pressures actually are doing given his blood pressures have been well controlled to soft over the past 2 years with systolic readings at his past 2 visits with [place] ranging from 100 to 106 mmHg.  He does not have his blood pressure cuff with him today for comparison.  I have hesitations on escalating his antihypertensive medication given historically, his blood pressures have been on the softer side.  I have advised him to bring his blood pressure cuff to our office for  comparison with 1 of our nurses.  If this shows his readings are comparable, we should consider 24-hour ambulatory BP monitoring.  I have also advised him to discontinue his morning visit to [employer] and decrease his sodium intake.  For now, continue current medications without change.  4. HLD: Statin intolerant. LDL of 78 from 11/2017. Consider PCSK-9 inhibitor in follow up.   5. Anxiety: Appears anxious today. On Zoloft. Follow up with PCP.   6. Daily headache: This has been present for 8 years and is unchanged.  This certainly may be related to some component of dehydration given his lack of water intake.  I have advised him to decrease sodium intake and increase water intake.  Follow-up with PCP.  Disposition: F/u with Dr. Kirke Corin or an APP in 3 months.   Current medicines are reviewed at length with the patient today.  The patient did not have any concerns regarding medicines.  Signed, Eula Listen, PA-C 03/15/2018 8:58 AM     [facility] [place] [address] [place], [place] [address] [phone]

## 2018-03-15 ENCOUNTER — Other Ambulatory Visit: Payer: Self-pay

## 2018-03-15 ENCOUNTER — Ambulatory Visit: Payer: Medicare Other | Admitting: Physician Assistant

## 2018-03-15 ENCOUNTER — Encounter: Payer: Self-pay | Admitting: Physician Assistant

## 2018-03-15 VITALS — BP 106/60 | HR 71 | Ht 67.0 in | Wt 146.0 lb

## 2018-03-15 DIAGNOSIS — E785 Hyperlipidemia, unspecified: Secondary | ICD-10-CM

## 2018-03-15 DIAGNOSIS — Z950 Presence of cardiac pacemaker: Secondary | ICD-10-CM

## 2018-03-15 DIAGNOSIS — I10 Essential (primary) hypertension: Secondary | ICD-10-CM

## 2018-03-15 DIAGNOSIS — R519 Headache, unspecified: Secondary | ICD-10-CM

## 2018-03-15 DIAGNOSIS — R51 Headache: Secondary | ICD-10-CM

## 2018-03-15 DIAGNOSIS — I25118 Atherosclerotic heart disease of native coronary artery with other forms of angina pectoris: Secondary | ICD-10-CM | POA: Diagnosis not present

## 2018-03-15 DIAGNOSIS — I495 Sick sinus syndrome: Secondary | ICD-10-CM | POA: Diagnosis not present

## 2018-03-15 NOTE — Patient Instructions (Signed)
Medication Instructions:  Your physician recommends that you continue on your current medications as directed. Please refer to the Current Medication list given to you today.  If you need a refill on your cardiac medications before your next appointment, please call your pharmacy.   Lab work: None ordered  If you have labs (blood work) drawn today and your tests are completely normal, you will receive your results only by: Marland Kitchen MyChart Message (if you have MyChart) OR . A paper copy in the mail If you have any lab test that is abnormal or we need to change your treatment, we will call you to review the results.  Testing/Procedures: None ordered   Follow-Up: At [facility], you and your health needs are our priority.  As part of our continuing mission to provide you with exceptional heart care, we have created designated Provider Care Teams.  These Care Teams include your primary Cardiologist (physician) and Advanced Practice Providers (APPs -  Physician Assistants and Nurse Practitioners) who all work together to provide you with the care you need, when you need it. You will need a follow up appointment in 3 months. You may see Dr. Kirke Corin or Eula Listen, PA-C.     Any Other Special Instructions Will Be Listed Below (If Applicable). Please make RN BP visit for this Friday.  Please bring your home BP cuff and readings at that time.  Appt date _______ Appt time _______

## 2018-03-16 ENCOUNTER — Ambulatory Visit (INDEPENDENT_AMBULATORY_CARE_PROVIDER_SITE_OTHER): Payer: Medicare Other

## 2018-03-16 VITALS — BP 124/74 | HR 60 | Ht 67.0 in | Wt 147.4 lb

## 2018-03-16 DIAGNOSIS — R030 Elevated blood-pressure reading, without diagnosis of hypertension: Secondary | ICD-10-CM | POA: Diagnosis not present

## 2018-03-16 NOTE — Progress Notes (Signed)
1.) Reason for visit: Nurse BP check for home BP machine calibration   2.) Name of MD requesting visit: Eduard Clos  H&P: Patient seen today by RN for BP follow up and home BP machine calibration. He is here with his wife. He does not know the age of his BP machine. The patient reports that he has taken all of his morning medication this morning around 7:30am. He reports compliance with all of his prescribed medications. He has no complaints and feels well this morning. Manual BP checked in both arms (L) 130/78, (R) 124/74, HR 60bpm. BP rechecked using the patients automatic BP machine (L) 139/79 HR 69, (R) 135/75 HR 72. Advised patient that his BP machine is reading higher and seems to be inaccurate.    3.) ROS related to problem:  Patient is asymptomatic. Denies chest pain, shortness of breath, palpitations, swelling, headache.  4.) Assessment and plan per MD: Update given to Eula Listen, PA. Alycia Rossetti recommends that the patient purchase a new BP machine, and continue to monitor his BP regularly. Hold off on amb BP monitoring at this time. Patient should follow up as planned in 3 months. Provided the patient with a written script for an automatic BP machine. Advised the patient to take the script to his local medical supply store to see if it could be covered by insurance. Advised the patient to request a over the arm automatic BP machine.  Advised the patient to check his BP once daily, 2-3 hours after he has taken his morning medications. Recommended that he try to monitor his BP around the same time daily and keep a log. He should try and avoid checking his BP several times a day. Patient verbalized understanding and voiced appreciation for the assistance.

## 2018-03-17 ENCOUNTER — Ambulatory Visit: Payer: Medicare Other

## 2018-05-30 ENCOUNTER — Ambulatory Visit (INDEPENDENT_AMBULATORY_CARE_PROVIDER_SITE_OTHER): Payer: Medicare Other | Admitting: *Deleted

## 2018-05-30 DIAGNOSIS — I495 Sick sinus syndrome: Secondary | ICD-10-CM

## 2018-05-30 DIAGNOSIS — R001 Bradycardia, unspecified: Secondary | ICD-10-CM

## 2018-05-31 LAB — CUP PACEART REMOTE DEVICE CHECK
Battery Impedance: 1502 Ohm
Battery Remaining Longevity: 48 mo
Battery Voltage: 2.78 V
Brady Statistic AP VP Percent: 1 %
Brady Statistic AP VS Percent: 79 %
Brady Statistic AS VP Percent: 0 %
Brady Statistic AS VS Percent: 20 %
Date Time Interrogation Session: 20200526204059
Implantable Lead Implant Date: 20100910
Implantable Lead Implant Date: 20100910
Implantable Lead Location: 753859
Implantable Lead Location: 753860
Implantable Lead Model: 5076
Implantable Lead Model: 5076
Implantable Pulse Generator Implant Date: 20100910
Lead Channel Impedance Value: 427 Ohm
Lead Channel Impedance Value: 658 Ohm
Lead Channel Pacing Threshold Amplitude: 0.75 V
Lead Channel Pacing Threshold Amplitude: 1.375 V
Lead Channel Pacing Threshold Pulse Width: 0.4 ms
Lead Channel Pacing Threshold Pulse Width: 0.4 ms
Lead Channel Setting Pacing Amplitude: 2 V
Lead Channel Setting Pacing Amplitude: 2.75 V
Lead Channel Setting Pacing Pulse Width: 0.4 ms
Lead Channel Setting Sensing Sensitivity: 5.6 mV
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
## 2018-06-05 ENCOUNTER — Telehealth (INDEPENDENT_AMBULATORY_CARE_PROVIDER_SITE_OTHER): Payer: Medicare Other | Admitting: Neurology

## 2018-06-05 ENCOUNTER — Other Ambulatory Visit: Payer: Self-pay

## 2018-06-05 ENCOUNTER — Encounter: Payer: Self-pay | Admitting: Neurology

## 2018-06-05 VITALS — Ht 67.0 in | Wt 135.0 lb

## 2018-06-05 DIAGNOSIS — G44229 Chronic tension-type headache, not intractable: Secondary | ICD-10-CM | POA: Diagnosis not present

## 2018-06-05 DIAGNOSIS — R42 Dizziness and giddiness: Secondary | ICD-10-CM

## 2018-06-05 MED ORDER — SERTRALINE HCL 50 MG PO TABS: 75.0000 mg | ORAL_TABLET | Freq: Every day | ORAL | 1 refills | Status: DC

## 2018-06-08 NOTE — Progress Notes (Signed)
Remote pacemaker transmission.   

## 2018-06-14 ENCOUNTER — Telehealth: Payer: Self-pay

## 2018-06-14 NOTE — Telephone Encounter (Signed)
Virtual Visit Pre-Appointment Phone Call  "(Name), I am calling you today to discuss your upcoming appointment. We are currently trying to limit exposure to the virus that causes COVID-19 by seeing patients at home rather than in the office."  1. "What is the BEST phone number to call the day of the visit?" - include this in appointment notes  2. "Do you have or have access to (through a family member/friend) a smartphone with video capability that we can use for your visit?" a. If yes - list this number in appt notes as "cell" (if different from BEST phone #) and list the appointment type as a VIDEO visit in appointment notes b. If no - list the appointment type as a PHONE visit in appointment notes  3. Confirm consent - "In the setting of the current Covid19 crisis, you are scheduled for a (phone or video) visit with your provider on (date) at (time).  Just as we do with many in-office visits, in order for you to participate in this visit, we must obtain consent.  If you'd like, I can send this to your mychart (if signed up) or email for you to review.  Otherwise, I can obtain your verbal consent now.  All virtual visits are billed to your insurance company just like a normal visit would be.  By agreeing to a virtual visit, we'd like you to understand that the technology does not allow for your provider to perform an examination, and thus may limit your provider's ability to fully assess your condition. If your provider identifies any concerns that need to be evaluated in person, we will make arrangements to do so.  Finally, though the technology is pretty good, we cannot assure that it will always work on either your or our end, and in the setting of a video visit, we may have to convert it to a phone-only visit.  In either situation, we cannot ensure that we have a secure connection.  Are you willing to proceed?" STAFF: Did the patient verbally acknowledge consent to telehealth visit? Document  YES/NO here: YES  4.   5. Advise patient to be prepared - "Two hours prior to your appointment, go ahead and check your blood pressure, pulse, oxygen saturation, and your weight (if you have the equipment to check those) and write them all down. When your visit starts, your provider will ask you for this information. If you have an Apple Watch or Kardia device, please plan to have heart rate information ready on the day of your appointment. Please have a pen and paper handy nearby the day of the visit as well."  6. Give patient instructions for MyChart download to smartphone OR Doximity/Doxy.me as below if video visit (depending on what platform provider is using)  7. Inform patient they will receive a phone call 15 minutes prior to their appointment time (may be from unknown caller ID) so they should be prepared to answer    TELEPHONE CALL NOTE  [name] ReiningClyde A [name] has been deemed a candidate for a follow-up tele-health visit to limit community exposure during the Covid-19 pandemic. I spoke with the patient via phone to ensure availability of phone/video source, confirm preferred email & phone number, and discuss instructions and expectations.  I reminded [name] to be prepared with any vital sign and/or heart rhythm information that could potentially be obtained via home monitoring, at the time of his visit. I reminded [name] to expect a phone  call prior to his visit.  [name], [place] 06/14/2018 11:01 AM   INSTRUCTIONS FOR DOWNLOADING THE MYCHART APP TO SMARTPHONE  - The patient must first make sure to have activated MyChart and know their login information - If Apple, go to [employer] and type in MyChart in the search bar and download the app. If Android, ask patient to go to [employer] and type in [place] in the search bar and download the app. The app is free but as with any other app downloads, their phone may require them to verify saved payment information or  Apple/Android password.  - The patient will need to then log into the app with their MyChart username and password, and select [facility] as their healthcare provider to link the account. When it is time for your visit, go to the MyChart app, find appointments, and click Begin Video Visit. Be sure to Select Allow for your device to access the Microphone and Camera for your visit. You will then be connected, and your provider will be with you shortly.  **If they have any issues connecting, or need assistance please contact MyChart service desk [phone])**  **If using a computer, in order to ensure the best quality for their visit they will need to use either of the following Internet Browsers: [employer], or Google Chrome**  IF USING DOXIMITY or DOXY.ME - The patient will receive a link just prior to their visit by text.     FULL LENGTH CONSENT FOR TELE-HEALTH VISIT   I hereby voluntarily request, consent and authorize [facility] and its employed or contracted physicians, physician assistants, nurse practitioners or other licensed health care professionals (the Practitioner), to provide me with telemedicine health care services (the "Services") as deemed necessary by the treating Practitioner. I acknowledge and consent to receive the Services by the Practitioner via telemedicine. I understand that the telemedicine visit will involve communicating with the Practitioner through live audiovisual communication technology and the disclosure of certain medical information by electronic transmission. I acknowledge that I have been given the opportunity to request an in-person assessment or other available alternative prior to the telemedicine visit and am voluntarily participating in the telemedicine visit.  I understand that I have the right to withhold or withdraw my consent to the use of telemedicine in the course of my care at any time, without affecting my right to future care  or treatment, and that the Practitioner or I may terminate the telemedicine visit at any time. I understand that I have the right to inspect all information obtained and/or recorded in the course of the telemedicine visit and may receive copies of available information for a reasonable fee.  I understand that some of the potential risks of receiving the Services via telemedicine include:  Marland Kitchen Delay or interruption in medical evaluation due to technological equipment failure or disruption; . Information transmitted may not be sufficient (e.g. poor resolution of images) to allow for appropriate medical decision making by the Practitioner; and/or  . In rare instances, security protocols could fail, causing a breach of personal health information.  Furthermore, I acknowledge that it is my responsibility to provide information about my medical history, conditions and care that is complete and accurate to the best of my ability. I acknowledge that Practitioner's advice, recommendations, and/or decision may be based on factors not within their control, such as incomplete or inaccurate data provided by me or distortions of diagnostic images or specimens that may result from  electronic transmissions. I understand that the practice of medicine is not an exact science and that Practitioner makes no warranties or guarantees regarding treatment outcomes. I acknowledge that I will receive a copy of this consent concurrently upon execution via email to the email address I last provided but may also request a printed copy by calling the office of [facility].    I understand that my insurance will be billed for this visit.   I have read or had this consent read to me. . I understand the contents of this consent, which adequately explains the benefits and risks of the Services being provided via telemedicine.  . I have been provided ample opportunity to ask questions regarding this consent and the Services and have had  my questions answered to my satisfaction. . I give my informed consent for the services to be provided through the use of telemedicine in my medical care  By participating in this telemedicine visit I agree to the above.

## 2018-06-20 ENCOUNTER — Telehealth: Payer: Medicare Other | Admitting: Cardiovascular Disease

## 2018-08-30 ENCOUNTER — Ambulatory Visit (INDEPENDENT_AMBULATORY_CARE_PROVIDER_SITE_OTHER): Payer: Medicare Other | Admitting: *Deleted

## 2018-08-30 DIAGNOSIS — I495 Sick sinus syndrome: Secondary | ICD-10-CM

## 2018-08-31 LAB — CUP PACEART REMOTE DEVICE CHECK
Battery Impedance: 1671 Ohm
Battery Remaining Longevity: 44 mo
Battery Voltage: 2.78 V
Brady Statistic AP VP Percent: 1 %
Brady Statistic AP VS Percent: 80 %
Brady Statistic AS VP Percent: 0 %
Brady Statistic AS VS Percent: 19 %
Date Time Interrogation Session: 20200827140751
Implantable Lead Implant Date: 20100910
Implantable Lead Implant Date: 20100910
Implantable Lead Location: 753859
Implantable Lead Location: 753860
Implantable Lead Model: 5076
Implantable Lead Model: 5076
Implantable Pulse Generator Implant Date: 20100910
Lead Channel Impedance Value: 476 Ohm
Lead Channel Impedance Value: 699 Ohm
Lead Channel Pacing Threshold Amplitude: 0.75 V
Lead Channel Pacing Threshold Amplitude: 1.625 V
Lead Channel Pacing Threshold Pulse Width: 0.4 ms
Lead Channel Pacing Threshold Pulse Width: 0.4 ms
Lead Channel Sensing Intrinsic Amplitude: 16 mV
Lead Channel Setting Pacing Amplitude: 2 V
Lead Channel Setting Pacing Amplitude: 3.25 V
Lead Channel Setting Pacing Pulse Width: 0.4 ms
Lead Channel Setting Sensing Sensitivity: 5.6 mV

## 2018-09-07 ENCOUNTER — Other Ambulatory Visit: Payer: Self-pay | Admitting: Orthopedic Surgery

## 2018-09-07 ENCOUNTER — Encounter: Payer: Self-pay | Admitting: Cardiology

## 2018-09-07 DIAGNOSIS — M25512 Pain in left shoulder: Secondary | ICD-10-CM

## 2018-09-07 NOTE — Progress Notes (Signed)
Remote pacemaker transmission.   

## 2018-09-20 ENCOUNTER — Ambulatory Visit
Admission: RE | Admit: 2018-09-20 | Discharge: 2018-09-20 | Disposition: A | Discharge status: Home / Self Care | Payer: Medicare Other | Source: Ambulatory Visit | Attending: Orthopedic Surgery | Admitting: Orthopedic Surgery

## 2018-09-20 ENCOUNTER — Other Ambulatory Visit: Payer: Self-pay

## 2018-09-20 DIAGNOSIS — M25512 Pain in left shoulder: Secondary | ICD-10-CM

## 2018-09-20 MED ORDER — IOPAMIDOL (ISOVUE-M 200) INJECTION 41%: 15.0000 mL | Freq: Once | INTRAMUSCULAR | Status: DC

## 2018-09-20 MED ORDER — IOPAMIDOL (ISOVUE-M 200) INJECTION 41%
13.0000 mL | Freq: Once | INTRAMUSCULAR | Status: AC
  Administered 2018-09-20: 13 mL via INTRA_ARTICULAR

## 2018-09-22 ENCOUNTER — Other Ambulatory Visit: Payer: Self-pay

## 2018-09-22 ENCOUNTER — Ambulatory Visit (INDEPENDENT_AMBULATORY_CARE_PROVIDER_SITE_OTHER): Payer: Medicare Other | Admitting: Cardiovascular Disease

## 2018-09-22 ENCOUNTER — Encounter: Payer: Self-pay | Admitting: Cardiovascular Disease

## 2018-09-22 VITALS — BP 118/78 | HR 87 | Ht 67.0 in | Wt 138.8 lb

## 2018-09-22 DIAGNOSIS — Z950 Presence of cardiac pacemaker: Secondary | ICD-10-CM

## 2018-09-22 DIAGNOSIS — I25118 Atherosclerotic heart disease of native coronary artery with other forms of angina pectoris: Secondary | ICD-10-CM

## 2018-09-22 DIAGNOSIS — E785 Hyperlipidemia, unspecified: Secondary | ICD-10-CM

## 2018-09-22 NOTE — Progress Notes (Signed)
Cardiology Office Note   Date:  09/22/2018   ID:  [name], DOB 01/04/1927, MRN [id number]  PCP:  Barron AlvineGibson, David, MD  Cardiologist:   Lorine BearsMuhammad Arida, MD   Chief Complaint  Patient presents with  . OTHER    12 month f/u no complaints today. Meds reviewed verbally with pt.      History of Present Illness: [name] is a 85 y.o. male who presents for  a follow-up visit  regarding coronary artery disease.  He has known history of coronary artery disease status post CABG in 2001. Most recent cardiac catheterization in 2010 showed patent grafts with 80% ostial left circumflex stenosis. Subsequent nuclear stress test in 2011 showed no evidence of ischemia with normal ejection fraction. He is also status post dual-chamber pacemaker placement monitored by Dr. Graciela HusbandsKlein. He has chronic headache.  He had myalgia with atorvastatin and did not want to go on any other medications.  He has been doing reasonably well and denies any chest pain or shortness of breath.  Dizziness or syncope.  Past Medical History:  Diagnosis Date  . CAD (coronary artery disease)    s/p CABG in 2001; lexiscan 2011  no ischemia and nl  LV function  . HLD (hyperlipidemia)   . Pacemaker   . Sinus node dysfunction (HCC)    s/p PPM  . Ventricular tachycardia, non-sustained [facility](HCC)     Past Surgical History:  Procedure Laterality Date  . BLADDER STONE REMOVAL  2005  . CARDIAC CATHETERIZATION  09/2008   patent grafts. 80% proximal stneosis in left circumflex artery (subsequent stress test showed no ischemia)  . cataract surgery     bilateral  . CORONARY ARTERY BYPASS GRAFT  2001   LIMA to LAD, SVG to RCA, SVG to ramus, sequential SVG to  first and second diagonal.  . INSERT / REPLACE / REMOVE PACEMAKER    . PACEMAKER INSERTION       Current Outpatient Medications  Medication Sig Dispense Refill  . aspirin 81 MG tablet Take 81 mg by mouth daily.      Marland Kitchen. gabapentin (NEURONTIN) 600 MG tablet Take 600 mg by  mouth 2 (two) times daily.     . metoprolol tartrate (LOPRESSOR) 25 MG tablet Take 1 tablet (25 mg total) by mouth 2 (two) times daily. 90 tablet 0  . Multiple Vitamin (MULTIVITAMIN) tablet Take 1 tablet by mouth daily.      . nitroGLYCERIN (NITROSTAT) 0.4 MG SL tablet Place 0.4 mg under the tongue every 5 (five) minutes as needed for chest pain.     Marland Kitchen. omeprazole (PRILOSEC) 20 MG capsule Take 20 mg by mouth daily.     Marland Kitchen. RANEXA 500 MG 12 hr tablet TAKE 1 TABLET BY MOUTH TWO  TIMES DAILY 180 tablet 4  . sertraline (ZOLOFT) 50 MG tablet Take 1.5 tablets (75 mg total) by mouth daily. 135 tablet 1  . traZODone (DESYREL) 100 MG tablet Take 200 mg by mouth at bedtime.   0  . Turmeric 500 MG CAPS Take by mouth daily.    . vitamin B-12 (CYANOCOBALAMIN) 1000 MCG tablet Take 1,000 mcg by mouth daily.     No current facility-administered medications for this visit.     Allergies:   Patient has no known allergies.    Social History:  The patient  reports that he has never smoked. He has never used smokeless tobacco. He reports that he does not drink alcohol or use drugs.  Family History:  The patient's family history includes Cancer in his mother; Heart attack in his brother, father, and another family member; Heart disease in his brother, father, and sister.    ROS:  Please see the history of present illness.   Otherwise, review of systems are positive for none.   All other systems are reviewed and negative.    PHYSICAL EXAM: VS:  BP 118/78 (BP Location: Left Arm, Patient Position: Sitting, Cuff Size: Normal)   Pulse 87   Ht 5\' 7"  (1.702 m)   Wt 138 lb 12 oz (62.9 kg)   SpO2 96%   BMI 21.73 kg/m  , BMI Body mass index is 21.73 kg/m. GEN: Well nourished, well developed, in no acute distress  HEENT: normal  Neck: no JVD, carotid bruits, or masses Cardiac: RRR; no murmurs, rubs, or gallops,no edema  Respiratory:  clear to auscultation bilaterally, normal work of breathing GI: soft,  nontender, nondistended, + BS MS: no deformity or atrophy  Skin: warm and dry, no rash Neuro:  Strength and sensation are intact Psych: euthymic mood, full affect   EKG:  EKG is ordered today. The ekg ordered today demonstrates atrial paced rhythm with no significant ST or T wave changes.   Recent Labs: No results found for requested labs within last 8760 hours.    Lipid Panel    Component Value Date/Time   CHOL  09/12/2008 0145    133        ATP III CLASSIFICATION:  <200     mg/dL   Desirable  200-239  mg/dL   Borderline High  >=240    mg/dL   High          TRIG 75 09/12/2008 0145   HDL 43 09/12/2008 0145   CHOLHDL 3.1 09/12/2008 0145   VLDL 15 09/12/2008 0145   LDLCALC  09/12/2008 0145    75        Total Cholesterol/HDL:CHD Risk Coronary Heart Disease Risk Table                     Men   Women  1/2 Average Risk   3.4   3.3  Average Risk       5.0   4.4  2 X Average Risk   9.6   7.1  3 X Average Risk  23.4   11.0        Use the calculated Patient Ratio above and the CHD Risk Table to determine the patient's CHD Risk.        ATP III CLASSIFICATION (LDL):  <100     mg/dL   Optimal  100-129  mg/dL   Near or Above                    Optimal  130-159  mg/dL   Borderline  160-189  mg/dL   High  >190     mg/dL   Very High      Wt Readings from Last 3 Encounters:  09/22/18 138 lb 12 oz (62.9 kg)  06/05/18 135 lb (61.2 kg)  03/16/18 147 lb 6.4 oz (66.9 kg)        ASSESSMENT AND PLAN:  1. Coronary artery disease involving native coronary arteries with stable angina :He has stable angina controlled with Ranexa and metoprolol. No significant change in symptoms over the last year. Continue same treatment.   2. Hyperlipidemia: The patient is intolerant to statins and most recently stopped atorvastatin.  I  have discussed alternatives with the patient in the past and he was not interested.  I do not think that is unreasonable given his age.  3. Status post  dual-chamber pacemaker placement: Followed by Dr. Graciela Husbands.    Disposition:   FU with me in 1 year  Signed,  Lorine Bears, MD  09/22/2018 3:42 PM    [facility]

## 2018-09-22 NOTE — Patient Instructions (Signed)
Medication Instructions:  Your physician recommends that you continue on your current medications as directed. Please refer to the Current Medication list given to you today.  If you need a refill on your cardiac medications before your next appointment, please call your pharmacy.   Lab work: None ordered If you have labs (blood work) drawn today and your tests are completely normal, you will receive your results only by: . MyChart Message (if you have MyChart) OR . A paper copy in the mail If you have any lab test that is abnormal or we need to change your treatment, we will call you to review the results.  Testing/Procedures: None ordered  Follow-Up: At [facility], you and your health needs are our priority.  As part of our continuing mission to provide you with exceptional heart care, we have created designated Provider Care Teams.  These Care Teams include your primary Cardiologist (physician) and Advanced Practice Providers (APPs -  Physician Assistants and Nurse Practitioners) who all work together to provide you with the care you need, when you need it. You will need a follow up appointment in 12 months.  Please call our office 2 months in advance to schedule this appointment.  You may see  Dr. Arida or one of the following Advanced Practice Providers on your designated Care Team:   Christopher Berge, NP Ryan Dunn, PA-C . Jacquelyn Visser, PA-C  Any Other Special Instructions Will Be Listed Below (If Applicable). N/A   
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
## 2018-10-09 ENCOUNTER — Telehealth (INDEPENDENT_AMBULATORY_CARE_PROVIDER_SITE_OTHER): Payer: Medicare Other | Admitting: Neurology

## 2018-10-09 ENCOUNTER — Other Ambulatory Visit: Payer: Self-pay

## 2018-10-09 ENCOUNTER — Encounter: Payer: Self-pay | Admitting: Neurology

## 2018-10-09 VITALS — BP 118/78 | Ht 67.0 in | Wt 140.0 lb

## 2018-10-09 DIAGNOSIS — G44229 Chronic tension-type headache, not intractable: Secondary | ICD-10-CM | POA: Diagnosis not present

## 2018-10-19 ENCOUNTER — Other Ambulatory Visit: Payer: Self-pay | Admitting: Cardiovascular Disease

## 2018-10-19 DIAGNOSIS — Z950 Presence of cardiac pacemaker: Secondary | ICD-10-CM

## 2018-10-19 DIAGNOSIS — R001 Bradycardia, unspecified: Secondary | ICD-10-CM

## 2018-10-23 ENCOUNTER — Other Ambulatory Visit: Payer: Self-pay | Admitting: Neurology

## 2018-10-24 NOTE — Telephone Encounter (Signed)
Requested Prescriptions   Pending Prescriptions Disp Refills  . sertraline (ZOLOFT) 50 MG tablet [Pharmacy Med Name: SERTRALINE HCL 50MG  TABLET] 135 tablet 3    Sig: TAKE 1 AND 1/2 TABLETS BY  MOUTH DAILY   Rx last filled: 06/05/18 #135 1 refills  Pt last seen:10/09/18 Assessment and Plan:   Chronic tension-type headache, not intractable.  I recommended increasing sertraline to 100mg  daily.  If headaches not improved in 2 months, we can consider adding tizanidine 2mg  at bedtime.  However, he may decide not to do this as he is sensitive to many medications which often make him dizzy.  1.  Increase sertraline to 100mg  daily. 2.  He will contact me in 8 weeks.  If headaches not improved, we may consider adding tizanidine 2mg  at bedtime  Follow up appt scheduled:02/21/19

## 2018-11-29 ENCOUNTER — Ambulatory Visit (INDEPENDENT_AMBULATORY_CARE_PROVIDER_SITE_OTHER): Payer: Medicare Other | Admitting: *Deleted

## 2018-11-29 DIAGNOSIS — R001 Bradycardia, unspecified: Secondary | ICD-10-CM

## 2018-11-29 LAB — CUP PACEART REMOTE DEVICE CHECK
Battery Impedance: 1784 Ohm
Battery Remaining Longevity: 42 mo
Battery Voltage: 2.78 V
Brady Statistic AP VP Percent: 1 %
Brady Statistic AP VS Percent: 81 %
Brady Statistic AS VP Percent: 0 %
Brady Statistic AS VS Percent: 18 %
Date Time Interrogation Session: 20201125121500
Implantable Lead Implant Date: 20100910
Implantable Lead Implant Date: 20100910
Implantable Lead Location: 753859
Implantable Lead Location: 753860
Implantable Lead Model: 5076
Implantable Lead Model: 5076
Implantable Pulse Generator Implant Date: 20100910
Lead Channel Impedance Value: 483 Ohm
Lead Channel Impedance Value: 717 Ohm
Lead Channel Pacing Threshold Amplitude: 0.75 V
Lead Channel Pacing Threshold Amplitude: 1.5 V
Lead Channel Pacing Threshold Pulse Width: 0.4 ms
Lead Channel Pacing Threshold Pulse Width: 0.4 ms
Lead Channel Setting Pacing Amplitude: 2 V
Lead Channel Setting Pacing Amplitude: 3 V
Lead Channel Setting Pacing Pulse Width: 0.4 ms
Lead Channel Setting Sensing Sensitivity: 5.6 mV

## 2018-12-26 NOTE — Progress Notes (Signed)
PPM remote 

## 2019-02-13 ENCOUNTER — Telehealth: Payer: Self-pay | Admitting: Cardiovascular Disease

## 2019-02-13 NOTE — Telephone Encounter (Signed)
   [facility] Pre-operative Risk Assessment    Request for surgical clearance:  1. What type of surgery is being performed? LT shoulder biceps tenotomy   2. When is this surgery scheduled? TBD  3. What type of clearance is required (medical clearance vs. Pharmacy clearance to hold med vs. Both)? Both   4. Are there any medications that need to be held prior to surgery and how long? is 81 MG aspirin ok to stop, how many days prior to surgery  5. Practice name and name of physician performing surgery? [facility], Dr Flossie Dibble  6. What is your office phone number [phone] (kelly)   7.   What is your office fax number [phone] ATTN: Kelly  8.   Anesthesia type (None, local, MAC, general) ? None listed    [name] 02/13/2019, 4:00 PM  _________________________________________________________________   (provider comments below)

## 2019-02-14 NOTE — Telephone Encounter (Signed)
That kind of surgery is not a high bleeding risk surgery. Should continue low dose Aspirin.

## 2019-02-14 NOTE — Telephone Encounter (Signed)
   Primary Cardiologist: Lorine Bears, MD  Chart reviewed as part of pre-operative protocol coverage. Patient was last seen by Dr. Kirke Corin on 09/22/2018 and was doing well from a cardiac standpoint at that time. Patient was contacted today in regards to pre-operative risk assessment, and reported he has done well since last visit. No chest pain, shortness of breath, palpitations, syncope, or acute CHF symptoms. 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   Given past medical history and time since last visit, based on ACC/AHA guidelines, [name] would be at acceptable risk for the planned procedure without further cardiovascular testing.   Per Dr. Kirke Corin, patient should continue low dose Aspirin as bicep tenotomy is not considered a high bleeding risk surgery.   I will route this recommendation to the requesting party via Epic fax function and remove from pre-op pool.  Please call with questions.  Corrin Parker, PA-C 02/14/2019, 2:38 PM

## 2019-02-14 NOTE — Telephone Encounter (Signed)
Hi Dr. Kirke Corin,  [name] has a upcoming left shoulder biceps tenotomy planned (date TBD). Can you please comment on how long he can hold Aspirin for? He has a history of CAD s/p CABG in 2001with most recent cardiac cath in 2010 showing patent grafts with 80% ostial LCX stenosis. Nuclear stress test in 2011 showed no ischemia.   Please route response back to P CV DIV PREOP.  Thank you! [place]

## 2019-02-20 NOTE — Progress Notes (Signed)
Patient did not respond to multiple phone call attempts.

## 2019-02-21 ENCOUNTER — Other Ambulatory Visit: Payer: Self-pay

## 2019-02-21 ENCOUNTER — Encounter: Payer: Self-pay | Admitting: Neurology

## 2019-02-21 ENCOUNTER — Telehealth (INDEPENDENT_AMBULATORY_CARE_PROVIDER_SITE_OTHER): Payer: Medicare Other | Admitting: Neurology

## 2019-02-27 ENCOUNTER — Ambulatory Visit: Payer: Self-pay | Admitting: Physician Assistant

## 2019-02-27 NOTE — H&P (View-Only) (Signed)
[name] is an 86 y.o. male.   Chief Complaint: left shoulder pain HPI: To review:  He had a CT arthrogram a good while ago which showed some glenohumeral degenerative changes. This did show subluxation of his biceps and partial subscapularis. Injection did not help him. He does have a pacemaker with a history of a bypass. Injection did not help him. He is reasonably debilitated by this. He has a sharp pain in the anterior aspect of his shoulder. When he is rotating his arm he definitely feels something mechanical. Interestingly, he had a spontaneous rupture of the opposite biceps.  A lot of his pain is anterior and I think it is biceps related.  Past Medical History:  Diagnosis Date  . CAD (coronary artery disease)    s/p CABG in 2001; lexiscan 2011  no ischemia and nl  LV function  . HLD (hyperlipidemia)   . Pacemaker   . Sinus node dysfunction (HCC)    s/p PPM  . Ventricular tachycardia, non-sustained [facility])     Past Surgical History:  Procedure Laterality Date  . BLADDER STONE REMOVAL  2005  . CARDIAC CATHETERIZATION  09/2008   patent grafts. 80% proximal stneosis in left circumflex artery (subsequent stress test showed no ischemia)  . cataract surgery     bilateral  . CORONARY ARTERY BYPASS GRAFT  2001   LIMA to LAD, SVG to RCA, SVG to ramus, sequential SVG to  first and second diagonal.  . INSERT / REPLACE / REMOVE PACEMAKER    . PACEMAKER INSERTION      Family History  Problem Relation Age of Onset  . Cancer Mother   . Heart disease Father   . Heart attack Father   . Heart disease Sister   . Heart attack Brother   . Heart disease Brother   . Heart attack Other    Social History:  reports that he has never smoked. He has never used smokeless tobacco. He reports that he does not drink alcohol or use drugs.  Allergies: No Known Allergies  (Not in a hospital admission)   No results found for this or any previous visit (from the past 48 hour(s)). No results  found.  Review of Systems  HENT: Positive for hearing loss.   Musculoskeletal: Positive for arthralgias and joint swelling.  Neurological: Positive for headaches.  All other systems reviewed and are negative.   There were no vitals taken for this visit. Physical Exam  Constitutional: He is oriented to person, place, and time. He appears well-developed and well-nourished. No distress.  HENT:  Head: Normocephalic and atraumatic.  Eyes: Pupils are equal, round, and reactive to light. Conjunctivae are normal.  Cardiovascular: Normal rate and intact distal pulses.  Respiratory: Effort normal. No respiratory distress.  GI: Soft. He exhibits no distension.  Musculoskeletal:     Left shoulder: Tenderness, bony tenderness and pain present. Decreased range of motion. Decreased strength.     Cervical back: Normal range of motion and neck supple.  Neurological: He is alert and oriented to person, place, and time.  Skin: Skin is warm and dry. No rash noted. No erythema.  Psychiatric: He has a normal mood and affect. His behavior is normal.     Assessment/Plan He has had over a year of watching this and he feels it is relatively debilitating. His choices are :  1. A total shoulder, which I think would definitely be too much treatment. He is not interested in that.   2.  Arthroscopy with either tenodesis or tenotomy. If he did have a tenodesis he would have to be immobilized 3-4 weeks. He knows what is like not having the long head intact on the opposite shoulder. I told him studies definitely do not show any superiority to tenodesis versus tenotomy, particularly in his activity level.  Therefore we will consider arthroscopy of the left shoulder, debridement, possible acromioplasty of the distal clavicle. We will definitely do a tenotomy based on the MRI and pain pattern. He will obviously need clearance from his medical and cardiology team. Proceed on with possible scheduling pending clearance.    Chriss Czar, PA-C 02/27/2019, 3:39 PM

## 2019-02-27 NOTE — H&P (Signed)
[name] is an 86 y.o. male.   Chief Complaint: left shoulder pain HPI: To review:  He had a CT arthrogram a good while ago which showed some glenohumeral degenerative changes. This did show subluxation of his biceps and partial subscapularis. Injection did not help him. He does have a pacemaker with a history of a bypass. Injection did not help him. He is reasonably debilitated by this. He has a sharp pain in the anterior aspect of his shoulder. When he is rotating his arm he definitely feels something mechanical. Interestingly, he had a spontaneous rupture of the opposite biceps.  A lot of his pain is anterior and I think it is biceps related.  Past Medical History:  Diagnosis Date  . CAD (coronary artery disease)    s/p CABG in 2001; lexiscan 2011  no ischemia and nl  LV function  . HLD (hyperlipidemia)   . Pacemaker   . Sinus node dysfunction (HCC)    s/p PPM  . Ventricular tachycardia, non-sustained [facility])     Past Surgical History:  Procedure Laterality Date  . BLADDER STONE REMOVAL  2005  . CARDIAC CATHETERIZATION  09/2008   patent grafts. 80% proximal stneosis in left circumflex artery (subsequent stress test showed no ischemia)  . cataract surgery     bilateral  . CORONARY ARTERY BYPASS GRAFT  2001   LIMA to LAD, SVG to RCA, SVG to ramus, sequential SVG to  first and second diagonal.  . INSERT / REPLACE / REMOVE PACEMAKER    . PACEMAKER INSERTION      Family History  Problem Relation Age of Onset  . Cancer Mother   . Heart disease Father   . Heart attack Father   . Heart disease Sister   . Heart attack Brother   . Heart disease Brother   . Heart attack Other    Social History:  reports that he has never smoked. He has never used smokeless tobacco. He reports that he does not drink alcohol or use drugs.  Allergies: No Known Allergies  (Not in a hospital admission)   No results found for this or any previous visit (from the past 48 hour(s)). No results  found.  Review of Systems  HENT: Positive for hearing loss.   Musculoskeletal: Positive for arthralgias and joint swelling.  Neurological: Positive for headaches.  All other systems reviewed and are negative.   There were no vitals taken for this visit. Physical Exam  Constitutional: He is oriented to person, place, and time. He appears well-developed and well-nourished. No distress.  HENT:  Head: Normocephalic and atraumatic.  Eyes: Pupils are equal, round, and reactive to light. Conjunctivae are normal.  Cardiovascular: Normal rate and intact distal pulses.  Respiratory: Effort normal. No respiratory distress.  GI: Soft. He exhibits no distension.  Musculoskeletal:     Left shoulder: Tenderness, bony tenderness and pain present. Decreased range of motion. Decreased strength.     Cervical back: Normal range of motion and neck supple.  Neurological: He is alert and oriented to person, place, and time.  Skin: Skin is warm and dry. No rash noted. No erythema.  Psychiatric: He has a normal mood and affect. His behavior is normal.     Assessment/Plan He has had over a year of watching this and he feels it is relatively debilitating. His choices are :  1. A total shoulder, which I think would definitely be too much treatment. He is not interested in that.   2.  Arthroscopy with either tenodesis or tenotomy. If he did have a tenodesis he would have to be immobilized 3-4 weeks. He knows what is like not having the long head intact on the opposite shoulder. I told him studies definitely do not show any superiority to tenodesis versus tenotomy, particularly in his activity level.  Therefore we will consider arthroscopy of the left shoulder, debridement, possible acromioplasty of the distal clavicle. We will definitely do a tenotomy based on the MRI and pain pattern. He will obviously need clearance from his medical and cardiology team. Proceed on with possible scheduling pending clearance.    Zyasia Halbleib, PA-C 02/27/2019, 3:39 PM   

## 2019-02-28 ENCOUNTER — Ambulatory Visit (INDEPENDENT_AMBULATORY_CARE_PROVIDER_SITE_OTHER): Payer: Medicare Other | Admitting: *Deleted

## 2019-02-28 DIAGNOSIS — R001 Bradycardia, unspecified: Secondary | ICD-10-CM

## 2019-02-28 LAB — CUP PACEART REMOTE DEVICE CHECK
Battery Impedance: 1810 Ohm
Battery Remaining Longevity: 41 mo
Battery Voltage: 2.77 V
Brady Statistic AP VP Percent: 1 %
Brady Statistic AP VS Percent: 81 %
Brady Statistic AS VP Percent: 0 %
Brady Statistic AS VS Percent: 18 %
Date Time Interrogation Session: 20210224145052
Implantable Lead Implant Date: 20100910
Implantable Lead Implant Date: 20100910
Implantable Lead Location: 753859
Implantable Lead Location: 753860
Implantable Lead Model: 5076
Implantable Lead Model: 5076
Implantable Pulse Generator Implant Date: 20100910
Lead Channel Impedance Value: 451 Ohm
Lead Channel Impedance Value: 684 Ohm
Lead Channel Pacing Threshold Amplitude: 0.75 V
Lead Channel Pacing Threshold Amplitude: 1.5 V
Lead Channel Pacing Threshold Pulse Width: 0.4 ms
Lead Channel Pacing Threshold Pulse Width: 0.4 ms
Lead Channel Setting Pacing Amplitude: 2 V
Lead Channel Setting Pacing Amplitude: 3 V
Lead Channel Setting Pacing Pulse Width: 0.4 ms
Lead Channel Setting Sensing Sensitivity: 5.6 mV

## 2019-03-01 NOTE — Progress Notes (Signed)
PPM Remote  

## 2019-03-05 NOTE — Patient Instructions (Addendum)
DUE TO COVID-19 ONLY ONE VISITOR IS ALLOWED TO COME WITH YOU AND STAY IN THE WAITING ROOM ONLY DURING PRE OP AND PROCEDURE DAY OF SURGERY. THE 1 VISITOR MAY VISIT WITH YOU AFTER SURGERY IN YOUR PRIVATE ROOM DURING VISITING HOURS ONLY!  YOU NEED TO HAVE A COVID 19 TEST ON__Tuesday 03/09/2021_____ @___   11:15 am____, THIS TEST MUST BE DONE BEFORE SURGERY, COME  [address], [place] , .  [facility]) ONCE YOUR COVID TEST IS COMPLETED, PLEASE BEGIN THE QUARANTINE INSTRUCTIONS AS OUTLINED IN YOUR HANDOUT.                [facility]    Your procedure is scheduled on: Friday 03/16/2019   Report to [facility] Main  Entrance    Report to admitting at   0745 AM     Call this number if you have problems the morning of surgery [phone]    Remember: Do not eat food :After Midnight.    NO SOLID FOOD AFTER MIDNIGHT THE NIGHT PRIOR TO SURGERY. NOTHING BY MOUTH EXCEPT CLEAR LIQUIDS UNTIL  0645 am .     PLEASE FINISH ENSURE DRINK PER SURGEON ORDER  WHICH NEEDS TO BE COMPLETED AT  0645 am .   CLEAR LIQUID DIET   Foods Allowed                                                                     Foods Excluded  Coffee and tea, regular and decaf                             liquids that you cannot  Plain Jell-O any favor except red or purple                                           see through such as: Fruit ices (not with fruit pulp)                                     milk, soups, orange juice  Iced Popsicles                                    All solid food Carbonated beverages, regular and diet                                    Cranberry, grape and apple juices Sports drinks like Gatorade Lightly seasoned clear broth or consume(fat free) Sugar, honey syrup  Sample Menu Breakfast                                Lunch  Supper Cranberry juice                    Beef broth                            Chicken  broth Jell-O                                     Grape juice                           Apple juice Coffee or tea                        Jell-O                                      Popsicle                                                Coffee or tea                        Coffee or tea  _____________________________________________________________________      BRUSH YOUR TEETH MORNING OF SURGERY AND RINSE YOUR MOUTH OUT, NO CHEWING GUM CANDY OR MINTS.     Take these medicines the morning of surgery with A SIP OF WATER: Ranolazine (Ranexa), Gabapentin (Neurontin), Sertraline (Zoloft), Metoprolol tartrate (Lopressor), Tamulosin (Flomax), Omeprazole  (Prilosec)                                 You may not have any metal on your body including hair pins and              piercings  Do not wear jewelry, make-up, lotions, powders or perfumes, deodorant                         Men may shave face and neck.   Do not bring valuables to the hospital. [facility] IS NOT             RESPONSIBLE   FOR VALUABLES.  Contacts, dentures or bridgework may not be worn into surgery.  Leave suitcase in the car. After surgery it may be brought to your room.     Patients discharged the day of surgery will not be allowed to drive home. IF YOU ARE HAVING SURGERY AND GOING HOME THE SAME DAY, YOU MUST HAVE AN ADULT TO DRIVE YOU HOME AND B E WITH YOU FOR 24 HOURS. YOU MAY GO HOME BY TAXI OR UBER OR ORTHERWISE, BUT AN ADULT MUST ACCOMPANY YOU HOME AND STAY WITH YOU FOR 24 HOURS.  Name and phone number of your driver:spouse- Harriett Sine  [phone]                Please read over the following fact sheets you were given: _____________________________________________________________________             [facility] - Preparing for Surgery Before surgery, you can  play an important role.  Because skin is not sterile, your skin needs to be as free of germs as possible.  You can reduce the number of germs on your  skin by washing with CHG (chlorahexidine gluconate) soap before surgery.  CHG is an antiseptic cleaner which kills germs and bonds with the skin to continue killing germs even after washing. Please DO NOT use if you have an allergy to CHG or antibacterial soaps.  If your skin becomes reddened/irritated stop using the CHG and inform your nurse when you arrive at Short Stay. Do not shave (including legs and underarms) for at least 48 hours prior to the first CHG shower.  You may shave your face/neck. Please follow these instructions carefully:  1.  Shower with CHG Soap the night before surgery and the  morning of Surgery.  2.  If you choose to wash your hair, wash your hair first as usual with your  normal  shampoo.  3.  After you shampoo, rinse your hair and body thoroughly to remove the  shampoo.                           4.  Use CHG as you would any other liquid soap.  You can apply chg directly  to the skin and wash                       Gently with a scrungie or clean washcloth.  5.  Apply the CHG Soap to your body ONLY FROM THE NECK DOWN.   Do not use on face/ open                           Wound or open sores. Avoid contact with eyes, ears mouth and genitals (private parts).                       Wash face,  Genitals (private parts) with your normal soap.             6.  Wash thoroughly, paying special attention to the area where your surgery  will be performed.  7.  Thoroughly rinse your body with warm water from the neck down.  8.  DO NOT shower/wash with your normal soap after using and rinsing off  the CHG Soap.                9.  Pat yourself dry with a clean towel.            10.  Wear clean pajamas.            11.  Place clean sheets on your bed the night of your first shower and do not  sleep with pets. Day of Surgery : Do not apply any lotions/deodorants the morning of surgery.  Please wear clean clothes to the hospital/surgery center.  FAILURE TO FOLLOW THESE INSTRUCTIONS MAY  RESULT IN THE CANCELLATION OF YOUR SURGERY PATIENT SIGNATURE_________________________________  NURSE SIGNATURE__________________________________  ________________________________________________________________________   [name]  An incentive spirometer is a tool that can help keep your lungs clear and active. This tool measures how well you are filling your lungs with each breath. Taking long deep breaths may help reverse or decrease the chance of developing breathing (pulmonary) problems (especially infection) following:  A long period of time when you are unable to move or be active.  BEFORE THE PROCEDURE   If the spirometer includes an indicator to show your best effort, your nurse or respiratory therapist will set it to a desired goal.  If possible, sit up straight or lean slightly forward. Try not to slouch.  Hold the incentive spirometer in an upright position. INSTRUCTIONS FOR USE  1. Sit on the edge of your bed if possible, or sit up as far as you can in bed or on a chair. 2. Hold the incentive spirometer in an upright position. 3. Breathe out normally. 4. Place the mouthpiece in your mouth and seal your lips tightly around it. 5. Breathe in slowly and as deeply as possible, raising the piston or the ball toward the top of the column. 6. Hold your breath for 3-5 seconds or for as long as possible. Allow the piston or ball to fall to the bottom of the column. 7. Remove the mouthpiece from your mouth and breathe out normally. 8. Rest for a few seconds and repeat Steps 1 through 7 at least 10 times every 1-2 hours when you are awake. Take your time and take a few normal breaths between deep breaths. 9. The spirometer may include an indicator to show your best effort. Use the indicator as a goal to work toward during each repetition. 10. After each set of 10 deep breaths, practice coughing to be sure your lungs are clear. If you have an incision (the cut made at the  time of surgery), support your incision when coughing by placing a pillow or rolled up towels firmly against it. Once you are able to get out of bed, walk around indoors and cough well. You may stop using the incentive spirometer when instructed by your caregiver.  RISKS AND COMPLICATIONS  Take your time so you do not get dizzy or light-headed.  If you are in pain, you may need to take or ask for pain medication before doing incentive spirometry. It is harder to take a deep breath if you are having pain. AFTER USE  Rest and breathe slowly and easily.  It can be helpful to keep track of a log of your progress. Your caregiver can provide you with a simple table to help with this. If you are using the spirometer at home, follow these instructions: SEEK MEDICAL CARE IF:   You are having difficultly using the spirometer.  You have trouble using the spirometer as often as instructed.  Your pain medication is not giving enough relief while using the spirometer.  You develop fever of 100.5 F (38.1 C) or higher. SEEK IMMEDIATE MEDICAL CARE IF:   You cough up bloody sputum that had not been present before.  You develop fever of 102 F (38.9 C) or greater.  You develop worsening pain at or near the incision site. MAKE SURE YOU:   Understand these instructions.  Will watch your condition.  Will get help right away if you are not doing well or get worse. Document Released: 05/03/2006 Document Revised: 03/15/2011 Document Reviewed: 07/04/2006 [facility] Patient Information 2014 [place], [place].   ________________________________________________________________________

## 2019-03-06 ENCOUNTER — Other Ambulatory Visit: Payer: Self-pay

## 2019-03-06 ENCOUNTER — Encounter (HOSPITAL_COMMUNITY)
Admission: RE | Admit: 2019-03-06 | Discharge: 2019-03-06 | Disposition: A | Discharge status: Home / Self Care | Payer: Medicare Other | Source: Ambulatory Visit | Attending: Orthopedic Surgery | Admitting: Orthopedic Surgery

## 2019-03-06 ENCOUNTER — Encounter (HOSPITAL_COMMUNITY): Payer: Self-pay

## 2019-03-06 DIAGNOSIS — I2510 Atherosclerotic heart disease of native coronary artery without angina pectoris: Secondary | ICD-10-CM | POA: Diagnosis not present

## 2019-03-06 DIAGNOSIS — Z01812 Encounter for preprocedural laboratory examination: Secondary | ICD-10-CM | POA: Diagnosis not present

## 2019-03-06 DIAGNOSIS — I252 Old myocardial infarction: Secondary | ICD-10-CM | POA: Diagnosis not present

## 2019-03-06 DIAGNOSIS — E785 Hyperlipidemia, unspecified: Secondary | ICD-10-CM | POA: Diagnosis not present

## 2019-03-06 DIAGNOSIS — Z7982 Long term (current) use of aspirin: Secondary | ICD-10-CM | POA: Insufficient documentation

## 2019-03-06 DIAGNOSIS — Z950 Presence of cardiac pacemaker: Secondary | ICD-10-CM | POA: Diagnosis not present

## 2019-03-06 DIAGNOSIS — M7522 Bicipital tendinitis, left shoulder: Secondary | ICD-10-CM | POA: Diagnosis not present

## 2019-03-06 DIAGNOSIS — Z7901 Long term (current) use of anticoagulants: Secondary | ICD-10-CM | POA: Insufficient documentation

## 2019-03-06 DIAGNOSIS — Z79899 Other long term (current) drug therapy: Secondary | ICD-10-CM | POA: Diagnosis not present

## 2019-03-06 DIAGNOSIS — I10 Essential (primary) hypertension: Secondary | ICD-10-CM | POA: Insufficient documentation

## 2019-03-06 HISTORY — DX: Essential (primary) hypertension: I10

## 2019-03-06 HISTORY — DX: Headache, unspecified: R51.9

## 2019-03-06 HISTORY — DX: Personal history of urinary calculi: Z87.442

## 2019-03-06 HISTORY — DX: Cardiac arrhythmia, unspecified: I49.9

## 2019-03-06 LAB — CBC
HCT: 38.5 % — ABNORMAL LOW (ref 39.0–52.0)
Hemoglobin: 12.1 g/dL — ABNORMAL LOW (ref 13.0–17.0)
MCH: 30.7 pg (ref 26.0–34.0)
MCHC: 31.4 g/dL (ref 30.0–36.0)
MCV: 97.7 fL (ref 80.0–100.0)
Platelets: 175 10*3/uL (ref 150–400)
RBC: 3.94 MIL/uL — ABNORMAL LOW (ref 4.22–5.81)
RDW: 13.7 % (ref 11.5–15.5)
WBC: 6.9 10*3/uL (ref 4.0–10.5)
nRBC: 0 % (ref 0.0–0.2)

## 2019-03-06 LAB — BASIC METABOLIC PANEL
Anion gap: 11 (ref 5–15)
BUN: 21 mg/dL (ref 8–23)
CO2: 26 mmol/L (ref 22–32)
Calcium: 9.1 mg/dL (ref 8.9–10.3)
Chloride: 107 mmol/L (ref 98–111)
Creatinine, Ser: 1.28 mg/dL — ABNORMAL HIGH (ref 0.61–1.24)
GFR calc Af Amer: 57 mL/min — ABNORMAL LOW (ref >60)
GFR calc non Af Amer: 49 mL/min — ABNORMAL LOW (ref >60)
Glucose, Bld: 96 mg/dL (ref 70–99)
Potassium: 5 mmol/L (ref 3.5–5.1)
Sodium: 144 mmol/L (ref 135–145)

## 2019-03-06 NOTE — Progress Notes (Signed)
PCP - Dr. Barron Alvine  Clearance on chart from [facility] 02/23/2019 Cardiologist - Dr. Lorine Bears clearance from 02/14/2019 on chart  LOV- 09/22/2018 epic EP physician- Dr. Graciela Husbands 02/28/2019- last device check epic  Chest x-ray - 01/02/2018  EKG - 09/22/2018 epic Stress Test - chemical 08/25/2009 ECHO - n/a Cardiac Cath - 09/2008 epic    Sleep Study - 04/20/2011 at [facility] CPAP - could not tolerate CPAP mask  Fasting Blood Sugar - n/a Checks Blood Sugar __0___ times a day  Blood Thinner Instructions:n/a Aspirin Instructions:Aspirin EC 81 mg  Per instructed by Dr. Kirke Corin for patient to not Stop Aspirin prior to surgery. Last Dose:03/05/2019  Anesthesia review:  Chart to be reviewed by Jodell Cipro, PA  Patient has a history of CAD, Pacemaker ([employer]), HTN,   Patient denies shortness of breath, fever, cough and chest pain at PAT appointment   Patient verbalized understanding of instructions that were given to them at the PAT appointment. Patient was also instructed that they will need to review over the PAT instructions again at home before surgery.

## 2019-03-07 NOTE — Progress Notes (Signed)
Anesthesia Chart Review   Case: [id number] Date/Time: 03/16/19 0932   Procedure: SHOULDER ARTHROSCOPY WITH SUBACROMIAL DECOMPRESSION AND DISTAL CLAVICLE EXCISION (Left )   Anesthesia type: Choice   Pre-op diagnosis: LEFT SHOULDER BICEP TENDONITIS   Location: [facility] ROOM 05 / [facility]   Surgeons: Frederico Hamman, MD      DISCUSSION:86 y.o. never smoker with h/o HLD, HTN, sleep apnea w/o device, CAD (CABG 2001), pacemaker (last device check 02/28/19), left shoulder bicep tendonitis scheduled for above procedure 03/16/19 with Dr. Frederico Hamman.   Pt cleared by cardiology 02/14/2019.  Per Marjie Skiff, PA-C, "Chart reviewed as part of pre-operative protocol coverage. Patient was last seen by Dr. Kirke Corin on 09/22/2018 and was doing well from a cardiac standpoint at that time. Patient was contacted today in regards to pre-operative risk assessment, and reported he has done well since last visit. No chest pain, shortness of breath, palpitations, syncope, or acute CHF symptoms. 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  Given past medical history and time since last visit, based on ACC/AHA guidelines, EPIC TRIBBETT would be at acceptable risk for the planned procedure without further cardiovascular testing.  Per Dr. Kirke Corin, patient should continue low dose Aspirin as bicep tenotomyis not considered a high bleeding risk surgery."  Pt cleared by PCP, Dr. Hollice Espy.  Clearance on chart which states pt is optimized for surgery from a medical standpoint only.  VS: BP (!) 158/99 (BP Location: Left Arm)   Pulse 87   Temp 36.6 C (Oral)   Resp 18   Ht 5\' 7"  (1.702 m)   Wt 66.3 kg   SpO2 98%   BMI 22.90 kg/m   PROVIDERS: , MD is PCP   Barron Alvine, MD is Cardiologist  LABS: Labs reviewed: Acceptable for surgery. (all labs ordered are listed, but only abnormal results are displayed)  Labs Reviewed  BASIC METABOLIC PANEL - Abnormal; Notable for the following  components:      Result Value   Creatinine, Ser 1.28 (*)    GFR calc non Af Amer 49 (*)    GFR calc Af Amer 57 (*)    All other components within normal limits  CBC - Abnormal; Notable for the following components:   RBC 3.94 (*)    Hemoglobin 12.1 (*)    HCT 38.5 (*)    All other components within normal limits     IMAGES:   EKG: 09/22/2018 Rate 87 bpm Atrial paced rhythm with prolonged AV conduction   CV: Echo 09/11/2008 Study Conclusions   1. Left ventricle: Wall thickness was increased in a pattern of mild   LVH. Systolic function was normal. The estimated ejection   fraction was in the range of 60% to 65%. Wall motion was normal;   there were no regional wall motion abnormalities. Doppler   parameters are consistent with restrictive physiology, indicative   of decreased left ventricular diastolic compliance and/or   increased left atrial pressure. Doppler parameters are consistent   with elevated ventricular end-diastolic filling pressure.  2. Aortic valve: Mild regurgitation.  3. Mitral valve: Mild regurgitation.  4. Left atrium: The atrium was moderately dilated.  5. Right atrium: The atrium was mildly dilated.  6. Pulmonary arteries: PA peak pressure: 75mm Hg (S).   Past Medical History:  Diagnosis Date  . CAD (coronary artery disease)    s/p CABG in 2001; lexiscan 2011  no ischemia and nl  LV function  .  Dysrhythmia   . Headache    headaches for 10 years-been to alot of physicians -cannot find problem  . History of kidney stones   . HLD (hyperlipidemia)   . Hypertension   . Myocardial infarction ([place]) 02/1999  . Pacemaker    sees Dr. Caryl Comes  . Pneumonia 11/30/2017  . Sinus node dysfunction ([facility])    s/p PPM  . Sleep apnea 04/20/2011   dx at [facility] not tolerate CPAP-does not use  . Ventricular tachycardia, non-sustained [facility])     Past Surgical History:  Procedure Laterality Date  . BLADDER STONE REMOVAL  2005   . CARDIAC CATHETERIZATION  09/2008   patent grafts. 80% proximal stneosis in left circumflex artery (subsequent stress test showed no ischemia)  . cataract surgery     bilateral  . CORONARY ARTERY BYPASS GRAFT  03/1999   LIMA to LAD, SVG to RCA, SVG to ramus, sequential SVG to  first and second diagonal.  . INSERT / REPLACE / REMOVE PACEMAKER    . PACEMAKER INSERTION  09/13/2008   insertion pacemaker    MEDICATIONS: . aspirin 81 MG tablet  . gabapentin (NEURONTIN) 600 MG tablet  . metoprolol tartrate (LOPRESSOR) 25 MG tablet  . Multiple Vitamin (MULTIVITAMIN) tablet  . nitroGLYCERIN (NITROSTAT) 0.4 MG SL tablet  . omeprazole (PRILOSEC) 20 MG capsule  . ranolazine (RANEXA) 500 MG 12 hr tablet  . sertraline (ZOLOFT) 50 MG tablet  . tamsulosin (FLOMAX) 0.4 MG CAPS capsule  . traZODone (DESYREL) 100 MG tablet  . triamcinolone cream (KENALOG) 0.1 %  . vitamin B-12 (CYANOCOBALAMIN) 1000 MCG tablet   No current facility-administered medications for this encounter.     Maia Plan [facility] Pre-Surgical Testing [phone] 03/07/19  1:26 PM

## 2019-03-13 ENCOUNTER — Other Ambulatory Visit (HOSPITAL_COMMUNITY)
Admission: RE | Admit: 2019-03-13 | Discharge: 2019-03-13 | Disposition: A | Discharge status: Home / Self Care | Payer: Medicare Other | Source: Ambulatory Visit | Attending: Orthopedic Surgery | Admitting: Orthopedic Surgery

## 2019-03-13 DIAGNOSIS — Z01812 Encounter for preprocedural laboratory examination: Secondary | ICD-10-CM | POA: Diagnosis present

## 2019-03-13 DIAGNOSIS — Z20822 Contact with and (suspected) exposure to covid-19: Secondary | ICD-10-CM | POA: Diagnosis not present

## 2019-03-13 LAB — SARS CORONAVIRUS 2 (TAT 6-24 HRS): SARS Coronavirus 2: NEGATIVE

## 2019-03-16 ENCOUNTER — Ambulatory Visit (HOSPITAL_COMMUNITY)
Admission: RE | Admit: 2019-03-16 | Discharge: 2019-03-16 | Disposition: A | Discharge status: Home / Self Care | Payer: Medicare Other | Attending: Orthopedic Surgery | Admitting: Orthopedic Surgery

## 2019-03-16 ENCOUNTER — Encounter (HOSPITAL_COMMUNITY): Payer: Self-pay | Admitting: Orthopedic Surgery

## 2019-03-16 ENCOUNTER — Ambulatory Visit (HOSPITAL_COMMUNITY): Payer: Medicare Other | Admitting: Physician Assistant

## 2019-03-16 ENCOUNTER — Ambulatory Visit (HOSPITAL_COMMUNITY): Payer: Medicare Other | Admitting: Anesthesiology

## 2019-03-16 ENCOUNTER — Encounter (HOSPITAL_COMMUNITY)
Admission: RE | Disposition: A | Discharge status: Home / Self Care | Payer: Self-pay | Source: Home / Self Care | Attending: Orthopedic Surgery

## 2019-03-16 DIAGNOSIS — I495 Sick sinus syndrome: Secondary | ICD-10-CM | POA: Diagnosis not present

## 2019-03-16 DIAGNOSIS — M75112 Incomplete rotator cuff tear or rupture of left shoulder, not specified as traumatic: Secondary | ICD-10-CM | POA: Insufficient documentation

## 2019-03-16 DIAGNOSIS — Z951 Presence of aortocoronary bypass graft: Secondary | ICD-10-CM | POA: Insufficient documentation

## 2019-03-16 DIAGNOSIS — I2510 Atherosclerotic heart disease of native coronary artery without angina pectoris: Secondary | ICD-10-CM | POA: Insufficient documentation

## 2019-03-16 DIAGNOSIS — M7542 Impingement syndrome of left shoulder: Secondary | ICD-10-CM | POA: Diagnosis not present

## 2019-03-16 DIAGNOSIS — S43492A Other sprain of left shoulder joint, initial encounter: Secondary | ICD-10-CM | POA: Diagnosis not present

## 2019-03-16 DIAGNOSIS — M7522 Bicipital tendinitis, left shoulder: Secondary | ICD-10-CM | POA: Diagnosis not present

## 2019-03-16 DIAGNOSIS — X58XXXA Exposure to other specified factors, initial encounter: Secondary | ICD-10-CM | POA: Insufficient documentation

## 2019-03-16 DIAGNOSIS — Z950 Presence of cardiac pacemaker: Secondary | ICD-10-CM | POA: Diagnosis not present

## 2019-03-16 DIAGNOSIS — M19012 Primary osteoarthritis, left shoulder: Secondary | ICD-10-CM | POA: Insufficient documentation

## 2019-03-16 DIAGNOSIS — G4730 Sleep apnea, unspecified: Secondary | ICD-10-CM | POA: Diagnosis not present

## 2019-03-16 DIAGNOSIS — I252 Old myocardial infarction: Secondary | ICD-10-CM | POA: Insufficient documentation

## 2019-03-16 DIAGNOSIS — I10 Essential (primary) hypertension: Secondary | ICD-10-CM | POA: Diagnosis not present

## 2019-03-16 SURGERY — SHOULDER ARTHROSCOPY WITH SUBACROMIAL DECOMPRESSION AND DISTAL CLAVICLE EXCISION
Anesthesia: General | Site: Shoulder | Laterality: Left

## 2019-03-16 MED ORDER — LACTATED RINGERS IV SOLN: INTRAVENOUS | Status: DC

## 2019-03-16 MED ORDER — ONDANSETRON HCL 4 MG/2ML IJ SOLN
INTRAMUSCULAR | Status: DC | PRN
  Administered 2019-03-16: 4 mg via INTRAVENOUS

## 2019-03-16 MED ORDER — PROPOFOL 10 MG/ML IV BOLUS
INTRAVENOUS | Status: AC
  Filled 2019-03-16: qty 20

## 2019-03-16 MED ORDER — CEFAZOLIN SODIUM-DEXTROSE 2-4 GM/100ML-% IV SOLN
2.0000 g | INTRAVENOUS | Status: AC
  Administered 2019-03-16: 2 g via INTRAVENOUS

## 2019-03-16 MED ORDER — FENTANYL CITRATE (PF) 100 MCG/2ML IJ SOLN
INTRAMUSCULAR | Status: DC | PRN
  Administered 2019-03-16: 50 ug via INTRAVENOUS

## 2019-03-16 MED ORDER — ROCURONIUM BROMIDE 10 MG/ML (PF) SYRINGE
PREFILLED_SYRINGE | INTRAVENOUS | Status: DC | PRN
  Administered 2019-03-16: 40 mg via INTRAVENOUS

## 2019-03-16 MED ORDER — DEXAMETHASONE SODIUM PHOSPHATE 10 MG/ML IJ SOLN
INTRAMUSCULAR | Status: AC
  Filled 2019-03-16: qty 1

## 2019-03-16 MED ORDER — BUPIVACAINE LIPOSOME 1.3 % IJ SUSP
INTRAMUSCULAR | Status: DC | PRN
  Administered 2019-03-16: 10 mL via PERINEURAL

## 2019-03-16 MED ORDER — FENTANYL CITRATE (PF) 100 MCG/2ML IJ SOLN
INTRAMUSCULAR | Status: AC
  Administered 2019-03-16: 50 ug via INTRAVENOUS
  Filled 2019-03-16: qty 2

## 2019-03-16 MED ORDER — SODIUM CHLORIDE 0.9 % IR SOLN
Status: DC | PRN
  Administered 2019-03-16: 9000 mL

## 2019-03-16 MED ORDER — PROPOFOL 10 MG/ML IV BOLUS
INTRAVENOUS | Status: DC | PRN
  Administered 2019-03-16: 110 mg via INTRAVENOUS

## 2019-03-16 MED ORDER — OXYCODONE HCL 5 MG/5ML PO SOLN: 5.0000 mg | Freq: Once | ORAL | Status: DC | PRN

## 2019-03-16 MED ORDER — SUGAMMADEX SODIUM 500 MG/5ML IV SOLN
INTRAVENOUS | Status: DC | PRN
  Administered 2019-03-16: 200 mg via INTRAVENOUS

## 2019-03-16 MED ORDER — PROMETHAZINE HCL 25 MG/ML IJ SOLN: 6.2500 mg | INTRAMUSCULAR | Status: DC | PRN

## 2019-03-16 MED ORDER — MIDAZOLAM HCL 2 MG/2ML IJ SOLN: 1.0000 mg | INTRAMUSCULAR | Status: DC

## 2019-03-16 MED ORDER — OXYCODONE HCL 5 MG PO TABS: 5.0000 mg | ORAL_TABLET | Freq: Once | ORAL | Status: DC | PRN

## 2019-03-16 MED ORDER — FENTANYL CITRATE (PF) 100 MCG/2ML IJ SOLN: 50.0000 ug | INTRAMUSCULAR | Status: DC

## 2019-03-16 MED ORDER — CEFAZOLIN SODIUM-DEXTROSE 2-4 GM/100ML-% IV SOLN
INTRAVENOUS | Status: AC
  Filled 2019-03-16: qty 100

## 2019-03-16 MED ORDER — FENTANYL CITRATE (PF) 100 MCG/2ML IJ SOLN
INTRAMUSCULAR | Status: AC
  Filled 2019-03-16: qty 2

## 2019-03-16 MED ORDER — PHENYLEPHRINE HCL-NACL 10-0.9 MG/250ML-% IV SOLN
INTRAVENOUS | Status: DC | PRN
  Administered 2019-03-16: 100 ug/min via INTRAVENOUS

## 2019-03-16 MED ORDER — LIDOCAINE 2% (20 MG/ML) 5 ML SYRINGE
INTRAMUSCULAR | Status: DC | PRN
  Administered 2019-03-16: 60 mg via INTRAVENOUS

## 2019-03-16 MED ORDER — LIDOCAINE 2% (20 MG/ML) 5 ML SYRINGE
INTRAMUSCULAR | Status: AC
  Filled 2019-03-16: qty 5

## 2019-03-16 MED ORDER — MIDAZOLAM HCL 2 MG/2ML IJ SOLN
INTRAMUSCULAR | Status: AC
  Filled 2019-03-16: qty 2

## 2019-03-16 MED ORDER — ACETAMINOPHEN 500 MG PO TABS: 1000.0000 mg | ORAL_TABLET | Freq: Once | ORAL | Status: DC

## 2019-03-16 MED ORDER — SODIUM CHLORIDE 0.9 % IV SOLN: INTRAVENOUS | Status: DC

## 2019-03-16 MED ORDER — CHLORHEXIDINE GLUCONATE 4 % EX LIQD: 60.0000 mL | Freq: Once | CUTANEOUS | Status: DC

## 2019-03-16 MED ORDER — 0.9 % SODIUM CHLORIDE (POUR BTL) OPTIME
TOPICAL | Status: DC | PRN
  Administered 2019-03-16: 1000 mL

## 2019-03-16 MED ORDER — DEXAMETHASONE SODIUM PHOSPHATE 10 MG/ML IJ SOLN
INTRAMUSCULAR | Status: DC | PRN
  Administered 2019-03-16: 10 mg via INTRAVENOUS

## 2019-03-16 MED ORDER — FENTANYL CITRATE (PF) 100 MCG/2ML IJ SOLN: 25.0000 ug | INTRAMUSCULAR | Status: DC | PRN

## 2019-03-16 MED ORDER — ONDANSETRON HCL 4 MG/2ML IJ SOLN
INTRAMUSCULAR | Status: AC
  Filled 2019-03-16: qty 2

## 2019-03-16 MED ORDER — BUPIVACAINE-EPINEPHRINE (PF) 0.5% -1:200000 IJ SOLN
INTRAMUSCULAR | Status: DC | PRN
  Administered 2019-03-16: 15 mL via PERINEURAL

## 2019-03-16 MED ORDER — EPHEDRINE SULFATE-NACL 50-0.9 MG/10ML-% IV SOSY
PREFILLED_SYRINGE | INTRAVENOUS | Status: DC | PRN
  Administered 2019-03-16: 10 mg via INTRAVENOUS
  Administered 2019-03-16: 20 mg via INTRAVENOUS

## 2019-03-16 MED ORDER — HYDROCODONE-ACETAMINOPHEN 5-325 MG PO TABS: 1.0000 | ORAL_TABLET | ORAL | 0 refills | Status: DC | PRN

## 2019-03-16 MED ORDER — PHENYLEPHRINE 40 MCG/ML (10ML) SYRINGE FOR IV PUSH (FOR BLOOD PRESSURE SUPPORT)
PREFILLED_SYRINGE | INTRAVENOUS | Status: DC | PRN
  Administered 2019-03-16 (×2): 120 ug via INTRAVENOUS
  Administered 2019-03-16: 80 ug via INTRAVENOUS

## 2019-03-16 SURGICAL SUPPLY — 54 items
BLADE SURG SZ11 CARB STEEL (BLADE) ×3 IMPLANT
BURR OVAL 8 FLU 4.0MM X 13CM (MISCELLANEOUS)
BURR OVAL 8 FLU 4.0X13 (MISCELLANEOUS) IMPLANT
BURR OVAL 8 FLU 5.0MM X 13CM (MISCELLANEOUS)
BURR OVAL 8 FLU 5.0X13 (MISCELLANEOUS) IMPLANT
CANNULA 5.75X7 CRYSTAL CLEAR (CANNULA) ×3 IMPLANT
COVER SURGICAL LIGHT HANDLE (MISCELLANEOUS) ×3 IMPLANT
COVER WAND RF STERILE (DRAPES) IMPLANT
DECANTER SPIKE VIAL GLASS SM (MISCELLANEOUS) IMPLANT
DRAPE SHOULDER BEACH CHAIR (DRAPES) IMPLANT
DRAPE STERI 35X30 U-POUCH (DRAPES) ×3 IMPLANT
DRAPE SURG 17X11 SM STRL (DRAPES) IMPLANT
DRAPE U-SHAPE 47X51 STRL (DRAPES) IMPLANT
DRSG EMULSION OIL 3X3 NADH (GAUZE/BANDAGES/DRESSINGS) ×3 IMPLANT
DRSG PAD ABDOMINAL 8X10 ST (GAUZE/BANDAGES/DRESSINGS) ×3 IMPLANT
EXCALIBUR 3.8MM X 13CM (MISCELLANEOUS) ×3 IMPLANT
FIBER TAPE 2MM (SUTURE) IMPLANT
GAUZE SPONGE 4X4 12PLY STRL (GAUZE/BANDAGES/DRESSINGS) ×3 IMPLANT
GLOVE BIO SURGEON STRL SZ7.5 (GLOVE) ×3 IMPLANT
GLOVE SURG ORTHO 8.0 STRL STRW (GLOVE) ×3 IMPLANT
GOWN STRL REUS W/TWL XL LVL3 (GOWN DISPOSABLE) ×6 IMPLANT
KIT BASIN OR (CUSTOM PROCEDURE TRAY) IMPLANT
KIT POSITION SHOULDER SCHLEI (MISCELLANEOUS) IMPLANT
KIT TURNOVER KIT A (KITS) IMPLANT
MANIFOLD NEPTUNE II (INSTRUMENTS) ×3 IMPLANT
NEEDLE HYPO 21X1.5 SAFETY (NEEDLE) ×3 IMPLANT
NEEDLE SCORPION MULTI FIRE (NEEDLE) IMPLANT
NEEDLE SPNL 18GX3.5 QUINCKE PK (NEEDLE) ×6 IMPLANT
PACK SHOULDER (CUSTOM PROCEDURE TRAY) ×3 IMPLANT
PENCIL SMOKE EVACUATOR (MISCELLANEOUS) IMPLANT
PORT APPOLLO RF 90DEGREE MULTI (SURGICAL WAND) IMPLANT
PROBE APOLLO 90XL (SURGICAL WAND) ×3 IMPLANT
PROTECTOR NERVE ULNAR (MISCELLANEOUS) ×3 IMPLANT
RESTRAINT HEAD UNIVERSAL NS (MISCELLANEOUS) IMPLANT
SLING ARM IMMOBILIZER LRG (SOFTGOODS) ×3 IMPLANT
SLING ARM IMMOBILIZER MED (SOFTGOODS) IMPLANT
SUT ETHIBOND NAB CT1 #1 30IN (SUTURE) ×6 IMPLANT
SUT ETHILON 3 0 PS 1 (SUTURE) ×3 IMPLANT
SUT ETHILON 4 0 PS 2 18 (SUTURE) IMPLANT
SUT TIGER TAPE 7 IN WHITE (SUTURE) IMPLANT
SUT VIC AB 0 CT1 27 (SUTURE) ×3
SUT VIC AB 0 CT1 27XBRD ANTBC (SUTURE) ×1 IMPLANT
SUT VIC AB 2-0 CT1 27 (SUTURE) ×3
SUT VIC AB 2-0 CT1 TAPERPNT 27 (SUTURE) ×1 IMPLANT
SUTURE TAPE TIGERLINK 1.3MM BL (SUTURE) IMPLANT
SUTURETAPE TIGERLINK 1.3MM BL (SUTURE)
SWIVELOCK BIO CLOSED 4.75 (Orthopedic Implant) ×12 IMPLANT
SYR 20ML LL LF (SYRINGE) ×3 IMPLANT
TAPE CLOTH SURG 6X10 WHT LF (GAUZE/BANDAGES/DRESSINGS) ×3 IMPLANT
TAPE FIBER 2MM 7IN #2 BLUE (SUTURE) IMPLANT
TOWEL OR 17X26 10 PK STRL BLUE (TOWEL DISPOSABLE) ×6 IMPLANT
TUBING ARTHROSCOPY IRRIG 16FT (MISCELLANEOUS) ×3 IMPLANT
TUBING CONNECTING 10 (TUBING) ×2 IMPLANT
TUBING CONNECTING 10' (TUBING) ×1

## 2019-03-16 NOTE — Interval H&P Note (Signed)
History and Physical Interval Note:  03/16/2019 9:53 AM  [name]  has presented today for surgery, with the diagnosis of LEFT SHOULDER BICEP TENDONITIS.  The various methods of treatment have been discussed with the patient and family. After consideration of risks, benefits and other options for treatment, the patient has consented to  Procedure(s): SHOULDER ARTHROSCOPY WITH SUBACROMIAL DECOMPRESSION AND DISTAL CLAVICLE EXCISION (Left) as a surgical intervention.  The patient's history has been reviewed, patient examined, no change in status, stable for surgery.  I have reviewed the patient's chart and labs.  Questions were answered to the patient's satisfaction.     Thera Flake

## 2019-03-16 NOTE — Anesthesia Procedure Notes (Signed)
Anesthesia Regional Block: Interscalene brachial plexus block   Pre-Anesthetic Checklist: ,, timeout performed, Correct Patient, Correct Site, Correct Laterality, Correct Procedure, Correct Position, site marked, Risks and benefits discussed, pre-op evaluation,  At surgeon's request and post-op pain management  Laterality: Left  Prep: Maximum Sterile Barrier Precautions used, chloraprep       Needles:  Injection technique: Single-shot  Needle Type: Echogenic Stimulator Needle     Needle Length: 4cm  Needle Gauge: 22     Additional Needles:   Procedures:,,,, ultrasound used (permanent image in chart),,,,  Narrative:  Start time: 03/16/2019 9:29 AM End time: 03/16/2019 9:32 AM Injection made incrementally with aspirations every 5 mL.  Performed by: Personally  Anesthesiologist: Kaylyn Layer, MD  Additional Notes: Risks, benefits, and alternative discussed. Patient gave consent for procedure. Patient prepped and draped in sterile fashion. Sedation administered, patient remains easily responsive to voice. Relevant anatomy identified with ultrasound guidance. Local anesthetic given in 5cc increments with no signs or symptoms of intravascular injection. No pain or paraesthesias with injection. Patient monitored throughout procedure with signs of LAST or immediate complications. Tolerated well. Ultrasound image placed in chart.  Amalia Greenhouse, MD

## 2019-03-16 NOTE — Brief Op Note (Signed)
03/16/2019  12:04 PM  PATIENT:  [name]  86 y.o. male  PRE-OPERATIVE DIAGNOSIS:  LEFT SHOULDER BICEP TENDONITIS  POST-OPERATIVE DIAGNOSIS:  LEFT SHOULDER BICEP TENDONITIS  PROCEDURE:  Procedure(s): SHOULDER ARTHROSCOPY WITH SUBACROMIAL DECOMPRESSION AND DISTAL CLAVICLE EXCISION debridement, acromioplasty, biceps tenotomy (Left)  SURGEON:  Surgeon(s) and Role:    Frederico Hamman, MD - Primary  PHYSICIAN ASSISTANT: Margart Sickles, PA-C  ASSISTANTS:    ANESTHESIA:   regional and general  EBL:  25 mL   BLOOD ADMINISTERED:none  DRAINS: none   LOCAL MEDICATIONS USED:  NONE  SPECIMEN:  No Specimen  DISPOSITION OF SPECIMEN:  N/A  COUNTS:  YES  TOURNIQUET:  * No tourniquets in log *  DICTATION: .Other Dictation: Dictation Number unknown  PLAN OF CARE: Discharge to home after PACU  PATIENT DISPOSITION:  PACU - hemodynamically stable.   Delay start of Pharmacological VTE agent (>24hrs) due to surgical blood loss or risk of bleeding: not applicable

## 2019-03-16 NOTE — Anesthesia Procedure Notes (Signed)
Procedure Name: Intubation Date/Time: 03/16/2019 11:06 AM Performed by: Gerald Leitz, CRNA Pre-anesthesia Checklist: Patient identified, Patient being monitored, Timeout performed, Emergency Drugs available and Suction available Patient Re-evaluated:Patient Re-evaluated prior to induction Oxygen Delivery Method: Circle system utilized Preoxygenation: Pre-oxygenation with 100% oxygen Induction Type: IV induction Ventilation: Mask ventilation without difficulty Laryngoscope Size: Mac and 3 Grade View: Grade I Tube type: Oral Tube size: 7.5 mm Number of attempts: 1 Placement Confirmation: ETT inserted through vocal cords under direct vision,  positive ETCO2 and breath sounds checked- equal and bilateral Secured at: 21 cm Tube secured with: Tape Dental Injury: Teeth and Oropharynx as per pre-operative assessment  Difficulty Due To: Difficult Airway- due to anterior larynx and Difficulty was unanticipated

## 2019-03-16 NOTE — Discharge Instructions (Signed)
Diet: As you were doing prior to hospitalization   Activity: Increase activity slowly as tolerated  No lifting or driving for 6 weeks   Shower: May shower without a dressing on post op day #3, NO SOAKING in tub   Dressing: You may change your dressing on post op day #3.  Then change the dressing daily with sterile 4"x4"s gauze dressing  Or band aids.   May discontinue sling 48-72 hours following surgery as tolerated.  Encourage early range of motion.  Weight Bearing: weight bearing as tolerated.  To prevent constipation: you may use a stool softener such as -  Colace ( over the counter) 100 mg by mouth twice a day  Drink plenty of fluids ( prune juice may be helpful) and high fiber foods  Miralax ( over the counter) for constipation as needed.   Precautions: If you experience chest pain or shortness of breath - call 911 immediately For transfer to the hospital emergency department!!  If you develop a fever greater that 101 F, purulent drainage from wound, increased redness or drainage from wound, or calf pain -- Call the office   Follow- Up Appointment: Please call for an appointment to be seen in 1 week  [place] - [phone]

## 2019-03-16 NOTE — Anesthesia Preprocedure Evaluation (Addendum)
Anesthesia Evaluation  Patient identified by MRN, date of birth, ID band Patient awake    Reviewed: Allergy & Precautions, NPO status , Patient's Chart, lab work & pertinent test results  History of Anesthesia Complications Negative for: history of anesthetic complications  Airway Mallampati: II  TM Distance: >3 FB Neck ROM: Full    Dental no notable dental hx.    Pulmonary sleep apnea ,    Pulmonary exam normal        Cardiovascular hypertension, + CAD, + Past MI (2001) and + CABG (2001)  Normal cardiovascular exam+ dysrhythmias Ventricular Tachycardia + pacemaker (for SSS)      Neuro/Psych negative neurological ROS  negative psych ROS   GI/Hepatic negative GI ROS, Neg liver ROS,   Endo/Other  negative endocrine ROS  Renal/GU Renal InsufficiencyRenal disease  negative genitourinary   Musculoskeletal negative musculoskeletal ROS (+)   Abdominal   Peds  Hematology negative hematology ROS (+)   Anesthesia Other Findings Day of surgery medications reviewed with patient.  Reproductive/Obstetrics negative OB ROS                            Anesthesia Physical Anesthesia Plan  ASA: III  Anesthesia Plan: General   Post-op Pain Management: GA combined w/ Regional for post-op pain   Induction: Intravenous  PONV Risk Score and Plan: 2 and Treatment may vary due to age or medical condition, Ondansetron and Dexamethasone  Airway Management Planned: Oral ETT  Additional Equipment: None  Intra-op Plan:   Post-operative Plan: Extubation in OR  Informed Consent: I have reviewed the patients History and Physical, chart, labs and discussed the procedure including the risks, benefits and alternatives for the proposed anesthesia with the patient or authorized representative who has indicated his/her understanding and acceptance.     Dental advisory given  Plan Discussed with:  CRNA  Anesthesia Plan Comments:        Anesthesia Quick Evaluation

## 2019-03-16 NOTE — Transfer of Care (Signed)
Immediate Anesthesia Transfer of Care Note  Patient: [name]  Procedure(s) Performed: Procedure(s): SHOULDER ARTHROSCOPY WITH SUBACROMIAL DECOMPRESSION AND DISTAL CLAVICLE EXCISION debridement, acromioplasty, biceps tenotomy (Left)  Patient Location: PACU  Anesthesia Type:General  Level of Consciousness: Alert, Awake, Oriented  Airway & Oxygen Therapy: Patient Spontanous Breathing  Post-op Assessment: Report given to RN  Post vital signs: Reviewed and stable  Last Vitals:  Vitals:   03/16/19 0937 03/16/19 1219  BP:  (!) 143/73  Pulse: 60 62  Resp: 12 11  Temp:    SpO2: 100% 100%    Complications: No apparent anesthesia complications

## 2019-03-16 NOTE — Anesthesia Postprocedure Evaluation (Signed)
Anesthesia Post Note  Patient: [name]  Procedure(s) Performed: SHOULDER ARTHROSCOPY WITH SUBACROMIAL DECOMPRESSION AND DISTAL CLAVICLE EXCISION debridement, acromioplasty, biceps tenotomy (Left Shoulder)     Patient location during evaluation: PACU Anesthesia Type: General Level of consciousness: awake and alert and oriented Pain management: pain level controlled Vital Signs Assessment: post-procedure vital signs reviewed and stable Respiratory status: spontaneous breathing, nonlabored ventilation and respiratory function stable Cardiovascular status: blood pressure returned to baseline Postop Assessment: no apparent nausea or vomiting Anesthetic complications: no    Last Vitals:  Vitals:   03/16/19 1300 03/16/19 1320  BP: 133/77 133/74  Pulse: 60 61  Resp: 12 18  Temp: (!) 36.3 C (!) 36.3 C  SpO2: 94% 94%    Last Pain:  Vitals:   03/16/19 1320  TempSrc:   PainSc: 3                  Kaylyn Layer

## 2019-03-16 NOTE — Progress Notes (Addendum)
Assisted Dr. Stephannie Peters with left, ultrasound guided, interscalene  block. Side rails up, monitors on throughout procedure. See vital signs in flow sheet. Tolerated Procedure well. exparel given at 0931, bracelet placed on patient, card placed in chart

## 2019-03-16 NOTE — Op Note (Signed)
[name], [name] MEDICAL RECORD [id number] ACCOUNT [id number] DATE OF BIRTH:[birth year] FACILITY: [facility] LOCATION: [facility] PHYSICIAN:W. Selenne Coggin JR., MD  OPERATIVE REPORT  DATE OF PROCEDURE:  03/16/2019  PREOPERATIVE DIAGNOSES:   1.  Partial subscapularis tear with medial subluxation and bicipital tendinitis. 2.  Impingement. 3.  Acromioclavicular joint arthritis. 4.  Degenerative tearing anterior superior labrum.  POSTOPERATIVE DIAGNOSES:  1.  Partial subscapularis tear with medial subluxation and bicipital tendinitis. 2.  Impingement. 3.  Acromioclavicular joint arthritis. 4.  Degenerative tearing anterior superior labrum.   OPERATION: 1.  Arthroscopic debridement (extensive). 2.  Arthroscopic acromioplasty. 3.  Arthroscopic distal clavicle. 4.  Arthroscopic biceps tenotomy all for the left shoulder.  SURGEON:  Marcie Mowers, MD  ASSISTANT:  Lonia Chimera, MD   ESTIMATED BLOOD LOSS:  Minimal.  DESCRIPTION OF PROCEDURE:  Beach chair position.  After block and general anesthetic posterior lateral and anterior portals were made.  Intraarticularly the patient had minimal degenerative change of the glenohumeral joint.  He had probably about a 1/3  of the anterior leading edge of the subscap torn off with medial subluxation and significant biceps inflammation, probably even some partial tearing.  We elected to tenotomize the biceps.  This was associated with degenerative tearing of the labrum,  which was debrided as well.  Undersurface of the cuff showed a partial width, partial thickness tear.  There was a bursal abrasion as well noted on the bursal side, but nothing approaching a full thickness tear.  Debridement of the rotator cuff was  carried out as well.  Subacromial space was hypertrophied and inflamed.  We released the CA ligament, performed acromioplasty.  Significant AC arthropathy was encountered and resected about a 1 to 1.5 cm of the distal clavicle.   Complete bursectomy was  carried out.  Shoulder was drained free of fluid.  Portals were closed with nylon.  Placed in a sling.  Taken to recovery room in stable condition.  CN/NUANCE  D:03/16/2019 T:03/16/2019 JOB:[id number]

## 2019-05-30 ENCOUNTER — Ambulatory Visit (INDEPENDENT_AMBULATORY_CARE_PROVIDER_SITE_OTHER): Payer: Medicare Other | Admitting: *Deleted

## 2019-05-30 DIAGNOSIS — R001 Bradycardia, unspecified: Secondary | ICD-10-CM

## 2019-05-30 LAB — CUP PACEART REMOTE DEVICE CHECK
Battery Impedance: 1956 Ohm
Battery Remaining Longevity: 39 mo
Battery Voltage: 2.77 V
Brady Statistic AP VP Percent: 2 %
Brady Statistic AP VS Percent: 81 %
Brady Statistic AS VP Percent: 0 %
Brady Statistic AS VS Percent: 17 %
Date Time Interrogation Session: 20210526124603
Implantable Lead Implant Date: 20100910
Implantable Lead Implant Date: 20100910
Implantable Lead Location: 753859
Implantable Lead Location: 753860
Implantable Lead Model: 5076
Implantable Lead Model: 5076
Implantable Pulse Generator Implant Date: 20100910
Lead Channel Impedance Value: 491 Ohm
Lead Channel Impedance Value: 727 Ohm
Lead Channel Pacing Threshold Amplitude: 0.75 V
Lead Channel Pacing Threshold Amplitude: 1.375 V
Lead Channel Pacing Threshold Pulse Width: 0.4 ms
Lead Channel Pacing Threshold Pulse Width: 0.4 ms
Lead Channel Setting Pacing Amplitude: 2 V
Lead Channel Setting Pacing Amplitude: 2.75 V
Lead Channel Setting Pacing Pulse Width: 0.4 ms
Lead Channel Setting Sensing Sensitivity: 5.6 mV

## 2019-05-31 NOTE — Progress Notes (Signed)
Remote pacemaker transmission.   

## 2019-08-14 ENCOUNTER — Other Ambulatory Visit: Payer: Self-pay | Admitting: Neurology

## 2019-08-29 ENCOUNTER — Ambulatory Visit (INDEPENDENT_AMBULATORY_CARE_PROVIDER_SITE_OTHER): Payer: Medicare Other | Admitting: *Deleted

## 2019-08-29 DIAGNOSIS — R001 Bradycardia, unspecified: Secondary | ICD-10-CM | POA: Diagnosis not present

## 2019-08-30 LAB — CUP PACEART REMOTE DEVICE CHECK
Battery Impedance: 2042 Ohm
Battery Remaining Longevity: 36 mo
Battery Voltage: 2.77 V
Brady Statistic AP VP Percent: 2 %
Brady Statistic AP VS Percent: 81 %
Brady Statistic AS VP Percent: 0 %
Brady Statistic AS VS Percent: 17 %
Date Time Interrogation Session: 20210825123318
Implantable Lead Implant Date: 20100910
Implantable Lead Implant Date: 20100910
Implantable Lead Location: 753859
Implantable Lead Location: 753860
Implantable Lead Model: 5076
Implantable Lead Model: 5076
Implantable Pulse Generator Implant Date: 20100910
Lead Channel Impedance Value: 476 Ohm
Lead Channel Impedance Value: 710 Ohm
Lead Channel Pacing Threshold Amplitude: 0.75 V
Lead Channel Pacing Threshold Amplitude: 1.625 V
Lead Channel Pacing Threshold Pulse Width: 0.4 ms
Lead Channel Pacing Threshold Pulse Width: 0.4 ms
Lead Channel Setting Pacing Amplitude: 2 V
Lead Channel Setting Pacing Amplitude: 3.25 V
Lead Channel Setting Pacing Pulse Width: 0.4 ms
Lead Channel Setting Sensing Sensitivity: 5.6 mV

## 2019-09-03 NOTE — Progress Notes (Signed)
Remote pacemaker transmission.   

## 2019-09-12 ENCOUNTER — Other Ambulatory Visit: Payer: Self-pay | Admitting: Cardiovascular Disease

## 2019-09-12 DIAGNOSIS — Z950 Presence of cardiac pacemaker: Secondary | ICD-10-CM

## 2019-09-12 DIAGNOSIS — R001 Bradycardia, unspecified: Secondary | ICD-10-CM

## 2019-09-27 ENCOUNTER — Other Ambulatory Visit: Payer: Self-pay | Admitting: Neurology

## 2019-11-01 ENCOUNTER — Encounter: Payer: Self-pay | Admitting: Family

## 2019-11-01 ENCOUNTER — Other Ambulatory Visit: Payer: Self-pay

## 2019-11-01 ENCOUNTER — Ambulatory Visit: Payer: Medicare Other | Admitting: Family

## 2019-11-01 VITALS — BP 128/68 | HR 70 | Ht 67.0 in | Wt 150.0 lb

## 2019-11-01 DIAGNOSIS — I25118 Atherosclerotic heart disease of native coronary artery with other forms of angina pectoris: Secondary | ICD-10-CM | POA: Diagnosis not present

## 2019-11-01 DIAGNOSIS — R001 Bradycardia, unspecified: Secondary | ICD-10-CM | POA: Diagnosis not present

## 2019-11-01 DIAGNOSIS — Z950 Presence of cardiac pacemaker: Secondary | ICD-10-CM

## 2019-11-01 DIAGNOSIS — E782 Mixed hyperlipidemia: Secondary | ICD-10-CM | POA: Diagnosis not present

## 2019-11-01 DIAGNOSIS — I495 Sick sinus syndrome: Secondary | ICD-10-CM

## 2019-11-01 MED ORDER — RANOLAZINE ER 500 MG PO TB12: 500.0000 mg | ORAL_TABLET | Freq: Two times a day (BID) | ORAL | 3 refills | Status: DC

## 2019-11-01 NOTE — Progress Notes (Signed)
Office Visit    Patient Name: [name] Date of Encounter: 11/01/2019  Primary Care Provider:  Barron Alvine, MD Primary Cardiologist:  Lorine Bears, MD Electrophysiologist:  None   Chief Complaint    [name] is a 86 y.o. male with a hx of CAD s/p CABG in 2001, sinus node dysfunction s/p dual chamber PPM, HLD intolerant to Lipitor secondary to myalgias, chronic headache, anxiety, anemia presents today for follow-up of coronary artery disease  Past Medical History    Past Medical History:  Diagnosis Date  . CAD (coronary artery disease)    s/p CABG in 2001; lexiscan 2011  no ischemia and nl  LV function  . Dysrhythmia   . Headache    headaches for 10 years-been to alot of physicians -cannot find problem  . History of kidney stones   . HLD (hyperlipidemia)   . Hypertension   . Myocardial infarction (HCC) 02/1999  . Pacemaker    sees Dr. Graciela Husbands  . Pneumonia 11/30/2017  . Sinus node dysfunction (HCC)    s/p PPM  . Sleep apnea 04/20/2011   dx at [facility] not tolerate CPAP-does not use  . Ventricular tachycardia, non-sustained [facility])    Past Surgical History:  Procedure Laterality Date  . BLADDER STONE REMOVAL  2005  . CARDIAC CATHETERIZATION  09/2008   patent grafts. 80% proximal stneosis in left circumflex artery (subsequent stress test showed no ischemia)  . cataract surgery     bilateral  . CORONARY ARTERY BYPASS GRAFT  03/1999   LIMA to LAD, SVG to RCA, SVG to ramus, sequential SVG to  first and second diagonal.  . INSERT / REPLACE / REMOVE PACEMAKER    . PACEMAKER INSERTION  09/13/2008   insertion pacemaker    Allergies  No Known Allergies  History of Present Illness    [name] is a 86 y.o. male with a hx of CAD s/p CABG in 2001, sinus node dysfunction s/p dual chamber PPM, HLD intolerant to Lipitor secondary to myalgias, chronic headache, anxiety, anemia last seen 09/22/2018 by Dr. Kirke Corin.  His coronary artery disease s/p CABG  in 2001.  He had catheterization in 2010 showing patent grafts to 80% ostial left circumflex stenosis.  Nuclear stress test in 2011 with no evidence of ischemia and normal LVEF.  He has a history of myalgia with atorvastatin has declined additional lipid-lowering therapy.  He was last seen by Dr. Kirke Corin 09/22/18. He reported no anginal symptoms and his current medications were continued.  Underwent left shoulder arthroscopy 03/16/19. Due to anemia and dysphagia he was referred to [facility] by his PCP 10/01/19.    Presents today for follow-up with his wife.  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  His 89th birthday is coming up next week and is planning to have a dinner with family.  Enjoys watching the [place] clubs, [place], and [place] teams play.  Endorses eating a low-sodium, heart healthy diet.  Reports no shortness of breath nor dyspnea on exertion. Reports no chest pressure, or tightness.  He continues to note very intermittent episodes of fleeting chest pain that have been stable for a number of years.  No edema, orthopnea, PND. Reports no palpitations.    EKGs/Labs/Other Studies Reviewed:   The following studies were reviewed today:  EKG:  EKG is ordered today.  The ekg ordered today demonstrates Atrial paced rhythm. NO  acute ST/T wave changes.   Recent Labs: 03/06/2019: BUN 21; Creatinine, Ser 1.28; Hemoglobin 12.1; Platelets 175; Potassium 5.0; Sodium 144  Recent Lipid Panel    Component Value Date/Time   CHOL  09/12/2008 0145    133        ATP III CLASSIFICATION:  <200     mg/dL   Desirable  299-371  mg/dL   Borderline High  >=696    mg/dL   High          TRIG 75 09/12/2008 0145   HDL 43 09/12/2008 0145   CHOLHDL 3.1 09/12/2008 0145   VLDL 15 09/12/2008 0145   LDLCALC  09/12/2008 0145    75        Total Cholesterol/HDL:CHD Risk Coronary Heart Disease Risk Table                      Men   Women  1/2 Average Risk   3.4   3.3  Average Risk       5.0   4.4  2 X Average Risk   9.6   7.1  3 X Average Risk  23.4   11.0        Use the calculated Patient Ratio above and the CHD Risk Table to determine the patient's CHD Risk.        ATP III CLASSIFICATION (LDL):  <100     mg/dL   Optimal  789-381  mg/dL   Near or Above                    Optimal  130-159  mg/dL   Borderline  017-510  mg/dL   High  >258     mg/dL   Very High    Home Medications   Current Meds  Medication Sig  . aspirin 81 MG tablet Take 81 mg by mouth at bedtime.   . gabapentin (NEURONTIN) 600 MG tablet Take 600 mg by mouth 2 (two) times daily.   . metoprolol tartrate (LOPRESSOR) 25 MG tablet Take 1 tablet (25 mg total) by mouth 2 (two) times daily.  . Multiple Vitamin (MULTIVITAMIN) tablet Take 1 tablet by mouth daily.    . nitroGLYCERIN (NITROSTAT) 0.4 MG SL tablet Place 0.4 mg under the tongue every 5 (five) minutes as needed for chest pain.   Marland Kitchen omeprazole (PRILOSEC) 20 MG capsule Take 20 mg by mouth daily.   . ranolazine (RANEXA) 500 MG 12 hr tablet Take 1 tablet (500 mg total) by mouth 2 (two) times daily.  . sertraline (ZOLOFT) 50 MG tablet TAKE 2 TABLETS BY MOUTH  DAILY (Patient taking differently: Take 50 mg by mouth daily. Take 2 tablets by mouth daily)  . tamsulosin (FLOMAX) 0.4 MG CAPS capsule Take 0.4 mg by mouth daily.   . traZODone (DESYREL) 100 MG tablet Take 200 mg by mouth at bedtime.   . triamcinolone cream (KENALOG) 0.1 % Apply 1 application topically daily as needed (rash/itching).  . vitamin B-12 (CYANOCOBALAMIN) 1000 MCG tablet Take 1,000 mcg by mouth daily.  . [DISCONTINUED] ranolazine (RANEXA) 500 MG 12 hr tablet TAKE 1 TABLET BY MOUTH  TWICE DAILY     Review of Systems  All other systems reviewed and are otherwise negative except as noted above.  Physical Exam    VS:  BP 128/68   Pulse 70   Ht 5\' 7"  (1.702 m)   Wt 150 lb (68 kg)   SpO2 94%   BMI  23.49 kg/m  , BMI  Body mass index is 23.49 kg/m.  Wt Readings from Last 3 Encounters:  11/01/19 150 lb (68 kg)  03/16/19 146 lb 3 oz (66.3 kg)  03/06/19 146 lb 3 oz (66.3 kg)    GEN: Well nourished, well developed, in no acute distress. HEENT: normal. Neck: Supple, no JVD, carotid bruits, or masses. Cardiac: RRR, no murmurs, rubs, or gallops. No clubbing, cyanosis, edema.  Radials/DP/PT 2+ and equal bilaterally.  Respiratory:  Respirations regular and unlabored, clear to auscultation bilaterally. GI: Soft, nontender, nondistended. MS: No deformity or atrophy. Skin: Warm and dry, no rash. Neuro:  Strength and sensation are intact. Psych: Normal affect.  Assessment & Plan    1. CAD- 09/24/19 K 4.4, creatinine 1.1, GFR 59.  Reports stable anginal symptoms.  GDMT includes aspirin, beta-blocker, Ranexa. Refills provided.  No statin secondary to previous intolerance.  2. HLD, LDL goal less than 70 - Previous intolerance to atorvastatin with myalgias. Lipid profile 09/24/19 triglycerides 129, LDL 80, HDL 62. 09/24/19 ALT 13 AST 25.  Continue lipid-lowering diet.  3. Sinus node dysfunction s/p PPM-PPM functioning appropriately by EKG today.  He is due for follow-up with Dr. Graciela Husbands we will schedule.  4. Anemia - 09/24/19 iron 27, TIBC 394, iron saturation 7%, Hb 10.2.  Continue to follow with primary care provider.  Disposition: Follow up next available with Dr. Graciela Husbands for in person device check and follow-up in 1 year(s) with Dr. Kirke Corin or APP   Signed, Alver Sorrow, NP 11/01/2019, 3:17 PM [facility]

## 2019-11-01 NOTE — Patient Instructions (Addendum)
Medication Instructions:  No medication changes today.  *If you need a refill on your cardiac medications before your next appointment, please call your pharmacy*   Lab Work: No lab work today.   If you have labs (blood work) drawn today and your tests are completely normal, you will receive your results only by: Marland Kitchen MyChart Message (if you have MyChart) OR . A paper copy in the mail If you have any lab test that is abnormal or we need to change your treatment, we will call you to review the results.  Testing/Procedures: Your EKG today showed your pacemaker is functioning appropriately.   Follow-Up: At [facility], you and your health needs are our priority.  As part of our continuing mission to provide you with exceptional heart care, we have created designated Provider Care Teams.  These Care Teams include your primary Cardiologist (physician) and Advanced Practice Providers (APPs -  Physician Assistants and Nurse Practitioners) who all work together to provide you with the care you need, when you need it.  We recommend signing up for the patient portal called "MyChart".  Sign up information is provided on this After Visit Summary.  MyChart is used to connect with patients for Virtual Visits (Telemedicine).  Patients are able to view lab/test results, encounter notes, upcoming appointments, etc.  Non-urgent messages can be sent to your provider as well.   To learn more about what you can do with MyChart, go to [email or web address].    Your next appointment:   Next available device check with Dr.Klein and in 1 year with Dr. Kirke Corin   Other Instructions

## 2019-11-28 ENCOUNTER — Ambulatory Visit (INDEPENDENT_AMBULATORY_CARE_PROVIDER_SITE_OTHER): Payer: Medicare Other

## 2019-11-28 DIAGNOSIS — R001 Bradycardia, unspecified: Secondary | ICD-10-CM

## 2019-11-28 LAB — CUP PACEART REMOTE DEVICE CHECK
Battery Impedance: 2158 Ohm
Battery Remaining Longevity: 35 mo
Battery Voltage: 2.76 V
Brady Statistic AP VP Percent: 2 %
Brady Statistic AP VS Percent: 81 %
Brady Statistic AS VP Percent: 0 %
Brady Statistic AS VS Percent: 17 %
Date Time Interrogation Session: 20211124123607
Implantable Lead Implant Date: 20100910
Implantable Lead Implant Date: 20100910
Implantable Lead Location: 753859
Implantable Lead Location: 753860
Implantable Lead Model: 5076
Implantable Lead Model: 5076
Implantable Pulse Generator Implant Date: 20100910
Lead Channel Impedance Value: 513 Ohm
Lead Channel Impedance Value: 750 Ohm
Lead Channel Pacing Threshold Amplitude: 0.875 V
Lead Channel Pacing Threshold Amplitude: 1.375 V
Lead Channel Pacing Threshold Pulse Width: 0.4 ms
Lead Channel Pacing Threshold Pulse Width: 0.4 ms
Lead Channel Setting Pacing Amplitude: 2 V
Lead Channel Setting Pacing Amplitude: 2.75 V
Lead Channel Setting Pacing Pulse Width: 0.4 ms
Lead Channel Setting Sensing Sensitivity: 5.6 mV

## 2019-12-07 NOTE — Progress Notes (Signed)
Remote pacemaker transmission.   

## 2020-01-29 ENCOUNTER — Encounter: Payer: Self-pay | Admitting: Internal Medicine

## 2020-01-29 ENCOUNTER — Ambulatory Visit: Payer: Medicare Other | Admitting: Internal Medicine

## 2020-01-29 ENCOUNTER — Other Ambulatory Visit: Payer: Self-pay

## 2020-01-29 VITALS — BP 140/86 | HR 83 | Ht 67.0 in | Wt 143.4 lb

## 2020-01-29 DIAGNOSIS — Z950 Presence of cardiac pacemaker: Secondary | ICD-10-CM

## 2020-01-29 DIAGNOSIS — I495 Sick sinus syndrome: Secondary | ICD-10-CM

## 2020-01-29 MED ORDER — ISOSORBIDE MONONITRATE ER 30 MG PO TB24: 30.0000 mg | ORAL_TABLET | Freq: Every day | ORAL | 3 refills | Status: DC

## 2020-01-29 NOTE — Patient Instructions (Signed)
Medication Instructions:  - Your physician has recommended you make the following change in your medication:  1) START imdur (isosorbide mononitrate) 30 mg- take 1 tablet by mouth once daily   *If you need a refill on your cardiac medications before your next appointment, please call your pharmacy*   Lab Work: - none ordered  If you have labs (blood work) drawn today and your tests are completely normal, you will receive your results only by: Marland Kitchen MyChart Message (if you have MyChart) OR . A paper copy in the mail If you have any lab test that is abnormal or we need to change your treatment, we will call you to review the results.   Testing/Procedures: - none ordered   Follow-Up: At [facility], you and your health needs are our priority.  As part of our continuing mission to provide you with exceptional heart care, we have created designated Provider Care Teams.  These Care Teams include your primary Cardiologist (physician) and Advanced Practice Providers (APPs -  Physician Assistants and Nurse Practitioners) who all work together to provide you with the care you need, when you need it.  We recommend signing up for the patient portal called "MyChart".  Sign up information is provided on this After Visit Summary.  MyChart is used to connect with patients for Virtual Visits (Telemedicine).  Patients are able to view lab/test results, encounter notes, upcoming appointments, etc.  Non-urgent messages can be sent to your provider as well.   To learn more about what you can do with MyChart, go to [email or web address].    Your next appointment:   1 year(s)  The format for your next appointment:   In Person  Provider:   Sherryl Manges, MD   Other Instructions n/a

## 2020-01-29 NOTE — Progress Notes (Signed)
Patient Care Team: Barron Alvine, MD as PCP - General (Family Medicine) Iran Ouch, MD as PCP - Cardiology (Cardiology) Drema Dallas, DO as Consulting Physician (Neurology)   HPI  [name] is a 87 y.o. male Seen in followup for a pacemaker Medtronic implanted September 2010 for symptomatic bradycardia.   History of coronary artery disease with prior bypass surgery and PCI of the circumflex lesion September 2010. Because of symptoms of exertional shortness of breath and chest pain that was somewhat atypical he underwent a lexiscan stress testing August 2011 demonstrating normal left ventricular function and no ischemia .  Because of ongoing chest pain.  He was treated with ranolazine.  He has been doing pretty well, considering.  His wife took a terrible fall in early December 2021 resulting in a traumatic subdural, a 3-week hospitalization at [facility] and has returned home on a walker.  He is now working as her "nurse "also has had problems with intermittent chest discomfort again.  Typical for him.  Although most concerning episode for him lasted 3 days.  He lives in fear    Past Medical History:  Diagnosis Date  . CAD (coronary artery disease)    s/p CABG in 2001; lexiscan 2011  no ischemia and nl  LV function  . Dysrhythmia   . Headache    headaches for 10 years-been to alot of physicians -cannot find problem  . History of kidney stones   . HLD (hyperlipidemia)   . Hypertension   . Myocardial infarction (HCC) 02/1999  . Pacemaker    sees Dr. Graciela Husbands  . Pneumonia 11/30/2017  . Sinus node dysfunction (HCC)    s/p PPM  . Sleep apnea 04/20/2011   dx at [facility] not tolerate CPAP-does not use  . Ventricular tachycardia, non-sustained [facility])     Past Surgical History:  Procedure Laterality Date  . BLADDER STONE REMOVAL  2005  . CARDIAC CATHETERIZATION  09/2008   patent grafts. 80% proximal stneosis in left circumflex artery (subsequent stress test  showed no ischemia)  . cataract surgery     bilateral  . CORONARY ARTERY BYPASS GRAFT  03/1999   LIMA to LAD, SVG to RCA, SVG to ramus, sequential SVG to  first and second diagonal.  . INSERT / REPLACE / REMOVE PACEMAKER    . PACEMAKER INSERTION  09/13/2008   insertion pacemaker    Current Outpatient Medications  Medication Sig Dispense Refill  . aspirin 81 MG tablet Take 81 mg by mouth at bedtime.     . gabapentin (NEURONTIN) 600 MG tablet Take 600 mg by mouth 2 (two) times daily.     . metoprolol tartrate (LOPRESSOR) 25 MG tablet Take 1 tablet (25 mg total) by mouth 2 (two) times daily. 90 tablet 0  . Multiple Vitamin (MULTIVITAMIN) tablet Take 1 tablet by mouth daily.      Marland Kitchen neomycin-polymyxin b-dexamethasone (MAXITROL) 3.5-10000-0.1 SUSP SMARTSIG:In Eye(s)    . nitroGLYCERIN (NITROSTAT) 0.4 MG SL tablet Place 0.4 mg under the tongue every 5 (five) minutes as needed for chest pain.     Marland Kitchen omeprazole (PRILOSEC) 20 MG capsule Take 20 mg by mouth daily.    . ranolazine (RANEXA) 500 MG 12 hr tablet Take 1 tablet (500 mg total) by mouth 2 (two) times daily. 180 tablet 3  . sertraline (ZOLOFT) 50 MG tablet TAKE 2 TABLETS BY MOUTH  DAILY (Patient taking differently: Take 50 mg by mouth daily. Take 2 tablets  by mouth daily) 60 tablet 0  . tamsulosin (FLOMAX) 0.4 MG CAPS capsule Take 0.4 mg by mouth daily.     . traZODone (DESYREL) 100 MG tablet Take 200 mg by mouth at bedtime.   0  . triamcinolone cream (KENALOG) 0.1 % Apply 1 application topically daily as needed (rash/itching).    . vitamin B-12 (CYANOCOBALAMIN) 1000 MCG tablet Take 1,000 mcg by mouth daily.     No current facility-administered medications for this visit.    No Known Allergies  Review of Systems negative except from HPI and PMH  Physical Exam BP 140/86 (BP Location: Left Arm, Patient Position: Sitting, Cuff Size: Normal)   Pulse 83   Ht 5\' 7"  (1.702 m)   Wt 143 lb 6 oz (65 kg)   SpO2 98%   BMI 22.46 kg/m  Well  developed and well nourished in no acute distress HENT normal Neck supple with JVP-flat Clear Device pocket well healed; without hematoma or erythema.  There is no tethering  Regular rate and rhythm, no   murmur Abd-soft with active BS No Clubbing cyanosis   edema Skin-warm and dry A & Oriented  Grossly normal sensory and motor function  ECG atrial paced at 83 Intervals 32/09/40  Assessment and  Plan  Ischemic heart disease  With prior bypass surgery  Sinus bradycardia first-degree AV block  Pacemaker-Medtronic .     Headaches -chronic    Chest pains-atypical    Recurrent chest pain in the context of his ischemic heart disease and prior bypass surgery.  Pain continues to have significant atypical features, will try isosorbide mononitrate 30 mg adjunctive to his other medications (blood pressure is very good) and see how he does.  His first-degree AV block is quite profound and may be contributing to exercise intolerance as well.  Device function was normal.  Right now is been hard as he has been caring for his wife who was injured as noted above

## 2020-02-27 ENCOUNTER — Ambulatory Visit (INDEPENDENT_AMBULATORY_CARE_PROVIDER_SITE_OTHER): Payer: Medicare Other

## 2020-02-27 DIAGNOSIS — I495 Sick sinus syndrome: Secondary | ICD-10-CM

## 2020-02-28 LAB — CUP PACEART REMOTE DEVICE CHECK
Battery Impedance: 2320 Ohm
Battery Remaining Longevity: 32 mo
Battery Voltage: 2.75 V
Brady Statistic AP VP Percent: 5 %
Brady Statistic AP VS Percent: 79 %
Brady Statistic AS VP Percent: 1 %
Brady Statistic AS VS Percent: 15 %
Date Time Interrogation Session: 20220224131548
Implantable Lead Implant Date: 20100910
Implantable Lead Implant Date: 20100910
Implantable Lead Location: 753859
Implantable Lead Location: 753860
Implantable Lead Model: 5076
Implantable Lead Model: 5076
Implantable Pulse Generator Implant Date: 20100910
Lead Channel Impedance Value: 499 Ohm
Lead Channel Impedance Value: 672 Ohm
Lead Channel Pacing Threshold Amplitude: 0.75 V
Lead Channel Pacing Threshold Amplitude: 1.5 V
Lead Channel Pacing Threshold Pulse Width: 0.4 ms
Lead Channel Pacing Threshold Pulse Width: 0.4 ms
Lead Channel Setting Pacing Amplitude: 2 V
Lead Channel Setting Pacing Amplitude: 3 V
Lead Channel Setting Pacing Pulse Width: 0.4 ms
Lead Channel Setting Sensing Sensitivity: 5.6 mV

## 2020-03-06 NOTE — Progress Notes (Signed)
Remote pacemaker transmission.   

## 2020-05-28 ENCOUNTER — Ambulatory Visit (INDEPENDENT_AMBULATORY_CARE_PROVIDER_SITE_OTHER): Payer: Medicare Other

## 2020-05-28 DIAGNOSIS — Z950 Presence of cardiac pacemaker: Secondary | ICD-10-CM | POA: Diagnosis not present

## 2020-05-29 LAB — CUP PACEART REMOTE DEVICE CHECK
Battery Impedance: 2453 Ohm
Battery Remaining Longevity: 29 mo
Battery Voltage: 2.75 V
Brady Statistic AP VP Percent: 7 %
Brady Statistic AP VS Percent: 78 %
Brady Statistic AS VP Percent: 1 %
Brady Statistic AS VS Percent: 14 %
Date Time Interrogation Session: 20220525134233
Implantable Lead Implant Date: 20100910
Implantable Lead Implant Date: 20100910
Implantable Lead Location: 753859
Implantable Lead Location: 753860
Implantable Lead Model: 5076
Implantable Lead Model: 5076
Implantable Pulse Generator Implant Date: 20100910
Lead Channel Impedance Value: 481 Ohm
Lead Channel Impedance Value: 653 Ohm
Lead Channel Pacing Threshold Amplitude: 0.875 V
Lead Channel Pacing Threshold Amplitude: 1.625 V
Lead Channel Pacing Threshold Pulse Width: 0.4 ms
Lead Channel Pacing Threshold Pulse Width: 0.4 ms
Lead Channel Setting Pacing Amplitude: 2 V
Lead Channel Setting Pacing Amplitude: 3.25 V
Lead Channel Setting Pacing Pulse Width: 0.4 ms
Lead Channel Setting Sensing Sensitivity: 5.6 mV

## 2020-06-19 NOTE — Progress Notes (Signed)
Remote pacemaker transmission.   

## 2020-08-27 ENCOUNTER — Ambulatory Visit (INDEPENDENT_AMBULATORY_CARE_PROVIDER_SITE_OTHER): Payer: Medicare Other

## 2020-08-27 DIAGNOSIS — I495 Sick sinus syndrome: Secondary | ICD-10-CM | POA: Diagnosis not present

## 2020-08-28 LAB — CUP PACEART REMOTE DEVICE CHECK
Battery Impedance: 2457 Ohm
Battery Remaining Longevity: 28 mo
Battery Voltage: 2.75 V
Brady Statistic AP VP Percent: 4 %
Brady Statistic AP VS Percent: 82 %
Brady Statistic AS VP Percent: 1 %
Brady Statistic AS VS Percent: 13 %
Date Time Interrogation Session: 20220824154426
Implantable Lead Implant Date: 20100910
Implantable Lead Implant Date: 20100910
Implantable Lead Location: 753859
Implantable Lead Location: 753860
Implantable Lead Model: 5076
Implantable Lead Model: 5076
Implantable Pulse Generator Implant Date: 20100910
Lead Channel Impedance Value: 480 Ohm
Lead Channel Impedance Value: 587 Ohm
Lead Channel Pacing Threshold Amplitude: 0.875 V
Lead Channel Pacing Threshold Amplitude: 1.625 V
Lead Channel Pacing Threshold Pulse Width: 0.4 ms
Lead Channel Pacing Threshold Pulse Width: 0.4 ms
Lead Channel Setting Pacing Amplitude: 2 V
Lead Channel Setting Pacing Amplitude: 3.25 V
Lead Channel Setting Pacing Pulse Width: 0.4 ms
Lead Channel Setting Sensing Sensitivity: 5.6 mV

## 2020-09-01 ENCOUNTER — Other Ambulatory Visit: Payer: Self-pay | Admitting: Family

## 2020-09-01 DIAGNOSIS — Z950 Presence of cardiac pacemaker: Secondary | ICD-10-CM

## 2020-09-01 DIAGNOSIS — R001 Bradycardia, unspecified: Secondary | ICD-10-CM

## 2020-09-11 NOTE — Progress Notes (Signed)
Remote pacemaker transmission.   

## 2020-11-13 ENCOUNTER — Other Ambulatory Visit: Payer: Self-pay

## 2020-11-13 ENCOUNTER — Ambulatory Visit: Payer: Medicare Other | Admitting: Cardiovascular Disease

## 2020-11-13 ENCOUNTER — Encounter: Payer: Self-pay | Admitting: Cardiovascular Disease

## 2020-11-13 VITALS — BP 142/78 | HR 81 | Ht 67.0 in | Wt 141.5 lb

## 2020-11-13 DIAGNOSIS — Z950 Presence of cardiac pacemaker: Secondary | ICD-10-CM

## 2020-11-13 DIAGNOSIS — E785 Hyperlipidemia, unspecified: Secondary | ICD-10-CM

## 2020-11-13 DIAGNOSIS — I2510 Atherosclerotic heart disease of native coronary artery without angina pectoris: Secondary | ICD-10-CM | POA: Diagnosis not present

## 2020-11-13 NOTE — Progress Notes (Signed)
Cardiology Office Note   Date:  11/13/2020   ID:  [name], DOB Feb 14, 1927, MRN [id number]  PCP:  Barron Alvine, MD  Cardiologist:   Lorine Bears, MD   Chief Complaint  Patient presents with   Other    12 month f/u c/o headaches and unable to sleep at bedtime. Meds reviewed verbally with pt.       History of Present Illness: [name] is a 87 y.o. male who presents for  a follow-up visit  regarding coronary artery disease.  He has known history of coronary artery disease status post CABG in 2001. Most recent cardiac catheterization in 2010 showed patent grafts with 80% ostial left circumflex stenosis. Subsequent nuclear stress test in 2011 showed no evidence of ischemia with normal ejection fraction. He is also status post dual-chamber pacemaker placement monitored by Dr. Graciela Husbands. He has chronic headache.  He had myalgia with atorvastatin and did not want to go on any other medications.   Unfortunately, his wife suffered a major stroke recently and she is currently in a nursing home.  He has been very stressed about that.  He was trying to move her from the bed recently and developed substernal chest pain which he thought was due to a pulled muscle but the pain continued and thus he went to the emergency room.  Troponin was normal and EKG showed no ischemic changes.  He reports no further chest pain since then.  He is seems to be significantly stressed.   Past Medical History:  Diagnosis Date   CAD (coronary artery disease)    s/p CABG in 2001; lexiscan 2011  no ischemia and nl  LV function   Dysrhythmia    Headache    headaches for 10 years-been to alot of physicians -cannot find problem   History of kidney stones    HLD (hyperlipidemia)    Hypertension    Myocardial infarction [facility]) 02/1999   Pacemaker    sees Dr. Graciela Husbands   Pneumonia 11/30/2017   Sinus node dysfunction [facility])    s/p PPM   Sleep apnea 04/20/2011   dx at [facility] not tolerate  CPAP-does not use   Ventricular tachycardia, non-sustained     Past Surgical History:  Procedure Laterality Date   BLADDER STONE REMOVAL  2005   CARDIAC CATHETERIZATION  09/2008   patent grafts. 80% proximal stneosis in left circumflex artery (subsequent stress test showed no ischemia)   cataract surgery     bilateral   CORONARY ARTERY BYPASS GRAFT  03/1999   LIMA to LAD, SVG to RCA, SVG to ramus, sequential SVG to  first and second diagonal.   INSERT / REPLACE / REMOVE PACEMAKER     PACEMAKER INSERTION  09/13/2008   insertion pacemaker     Current Outpatient Medications  Medication Sig Dispense Refill   aspirin 81 MG tablet Take 81 mg by mouth at bedtime.      gabapentin (NEURONTIN) 600 MG tablet Take 600 mg by mouth 2 (two) times daily.      isosorbide mononitrate (IMDUR) 30 MG 24 hr tablet Take 1 tablet (30 mg total) by mouth daily. 90 tablet 3   metoprolol tartrate (LOPRESSOR) 25 MG tablet Take 1 tablet (25 mg total) by mouth 2 (two) times daily. 90 tablet 0   Multiple Vitamin (MULTIVITAMIN) tablet Take 1 tablet by mouth daily.       neomycin-polymyxin b-dexamethasone (MAXITROL) 3.5-10000-0.1 SUSP SMARTSIG:In Eye(s)     nitroGLYCERIN (  NITROSTAT) 0.4 MG SL tablet Place 0.4 mg under the tongue every 5 (five) minutes as needed for chest pain.      omeprazole (PRILOSEC) 20 MG capsule Take 20 mg by mouth daily.     ranolazine (RANEXA) 500 MG 12 hr tablet Take 1 tablet by mouth twice daily 60 tablet 2   sertraline (ZOLOFT) 50 MG tablet TAKE 2 TABLETS BY MOUTH  DAILY (Patient taking differently: Take 50 mg by mouth daily. Take 2 tablets by mouth daily) 60 tablet 0   tamsulosin (FLOMAX) 0.4 MG CAPS capsule Take 0.4 mg by mouth daily.      traZODone (DESYREL) 100 MG tablet Take 100-150 mg by mouth at bedtime.  0   triamcinolone cream (KENALOG) 0.1 % Apply 1 application topically daily as needed (rash/itching).     vitamin B-12 (CYANOCOBALAMIN) 1000 MCG tablet Take 1,000 mcg by mouth  daily.     No current facility-administered medications for this visit.    Allergies:   Patient has no known allergies.    Social History:  The patient  reports that he has never smoked. He has never used smokeless tobacco. He reports that he does not drink alcohol and does not use drugs.   Family History:  The patient's family history includes Cancer in his mother; Heart attack in his brother, father, and another family member; Heart disease in his brother, father, and sister.    ROS:  Please see the history of present illness.   Otherwise, review of systems are positive for none.   All other systems are reviewed and negative.    PHYSICAL EXAM: VS:  BP (!) 142/78 (BP Location: Right Arm, Patient Position: Sitting, Cuff Size: Normal)   Pulse 81   Ht 5\' 7"  (1.702 m)   Wt 141 lb 8 oz (64.2 kg)   SpO2 98%   BMI 22.16 kg/m  , BMI Body mass index is 22.16 kg/m. GEN: Well nourished, well developed, in no acute distress  HEENT: normal  Neck: no JVD, carotid bruits, or masses Cardiac: RRR; no murmurs, rubs, or gallops,no edema  Respiratory:  clear to auscultation bilaterally, normal work of breathing GI: soft, nontender, nondistended, + BS MS: no deformity or atrophy  Skin: warm and dry, no rash Neuro:  Strength and sensation are intact Psych: euthymic mood, full affect   EKG:  EKG is ordered today. The ekg ordered today demonstrates atrial paced rhythm with no significant ST changes.   Recent Labs: No results found for requested labs within last 8760 hours.    Lipid Panel    Component Value Date/Time   CHOL  09/12/2008 0145    133        ATP III CLASSIFICATION:  <200     mg/dL   Desirable  11/12/2008  mg/dL   Borderline High  749-449    mg/dL   High          TRIG 75 09/12/2008 0145   HDL 43 09/12/2008 0145   CHOLHDL 3.1 09/12/2008 0145   VLDL 15 09/12/2008 0145   LDLCALC  09/12/2008 0145    75        Total Cholesterol/HDL:CHD Risk Coronary Heart Disease Risk Table                      Men   Women  1/2 Average Risk   3.4   3.3  Average Risk       5.0   4.4  2 X  Average Risk   9.6   7.1  3 X Average Risk  23.4   11.0        Use the calculated Patient Ratio above and the CHD Risk Table to determine the patient's CHD Risk.        ATP III CLASSIFICATION (LDL):  <100     mg/dL   Optimal  924-462  mg/dL   Near or Above                    Optimal  130-159  mg/dL   Borderline  863-817  mg/dL   High  >711     mg/dL   Very High      Wt Readings from Last 3 Encounters:  11/13/20 141 lb 8 oz (64.2 kg)  01/29/20 143 lb 6 oz (65 kg)  11/01/19 150 lb (68 kg)        ASSESSMENT AND PLAN:  1. Coronary artery disease involving native coronary arteries with stable angina:He has stable angina controlled with Ranexa and metoprolol.  He had a recent episode of chest pain that does not seem to be cardiac.  It happened after carrying something heavy.  In addition, he has been under significant stress due to illness of his wife.  He did go to the emergency room and his troponin was normal.  No changes in his EKG.  No need for further work-up at the present time unless he has recurrent symptoms.  2. Hyperlipidemia: The patient is intolerant to statins and most recently stopped atorvastatin.  I have discussed alternatives with the patient in the past and he was not interested.  I do not think that is unreasonable given his age.  3. Status post dual-chamber pacemaker placement: Followed by Dr. Graciela Husbands.  4.  Stress and anxiety after his wife's recent stroke: I discussed with him coping mechanisms.   Disposition:   FU with me in 6 months  Signed,  Lorine Bears, MD  11/13/2020 9:40 AM    [facility]

## 2020-11-13 NOTE — Patient Instructions (Signed)

## 2020-11-26 ENCOUNTER — Ambulatory Visit (INDEPENDENT_AMBULATORY_CARE_PROVIDER_SITE_OTHER): Payer: Medicare Other

## 2020-11-26 DIAGNOSIS — I495 Sick sinus syndrome: Secondary | ICD-10-CM

## 2020-11-26 LAB — CUP PACEART REMOTE DEVICE CHECK
Battery Impedance: 2508 Ohm
Battery Remaining Longevity: 29 mo
Battery Voltage: 2.75 V
Brady Statistic AP VP Percent: 3 %
Brady Statistic AP VS Percent: 84 %
Brady Statistic AS VP Percent: 0 %
Brady Statistic AS VS Percent: 12 %
Date Time Interrogation Session: 20221123073232
Implantable Lead Implant Date: 20100910
Implantable Lead Implant Date: 20100910
Implantable Lead Location: 753859
Implantable Lead Location: 753860
Implantable Lead Model: 5076
Implantable Lead Model: 5076
Implantable Pulse Generator Implant Date: 20100910
Lead Channel Impedance Value: 500 Ohm
Lead Channel Impedance Value: 666 Ohm
Lead Channel Pacing Threshold Amplitude: 0.875 V
Lead Channel Pacing Threshold Amplitude: 1.5 V
Lead Channel Pacing Threshold Pulse Width: 0.4 ms
Lead Channel Pacing Threshold Pulse Width: 0.4 ms
Lead Channel Setting Pacing Amplitude: 2 V
Lead Channel Setting Pacing Amplitude: 3 V
Lead Channel Setting Pacing Pulse Width: 0.4 ms
Lead Channel Setting Sensing Sensitivity: 5.6 mV

## 2020-12-08 NOTE — Progress Notes (Signed)
Remote pacemaker transmission.   

## 2020-12-18 ENCOUNTER — Other Ambulatory Visit: Payer: Self-pay | Admitting: Family

## 2020-12-18 DIAGNOSIS — Z950 Presence of cardiac pacemaker: Secondary | ICD-10-CM

## 2020-12-18 DIAGNOSIS — R001 Bradycardia, unspecified: Secondary | ICD-10-CM

## 2020-12-18 NOTE — Telephone Encounter (Signed)
Rx(s) sent to pharmacy electronically.  

## 2021-02-25 ENCOUNTER — Ambulatory Visit (INDEPENDENT_AMBULATORY_CARE_PROVIDER_SITE_OTHER): Payer: Medicare Other

## 2021-02-25 DIAGNOSIS — I495 Sick sinus syndrome: Secondary | ICD-10-CM

## 2021-02-25 LAB — CUP PACEART REMOTE DEVICE CHECK
Battery Impedance: 2857 Ohm
Battery Remaining Longevity: 21 mo
Battery Voltage: 2.75 V
Brady Statistic AP VP Percent: 2 %
Brady Statistic AP VS Percent: 85 %
Brady Statistic AS VP Percent: 0 %
Brady Statistic AS VS Percent: 12 %
Date Time Interrogation Session: 20230222075322
Implantable Lead Implant Date: 20100910
Implantable Lead Implant Date: 20100910
Implantable Lead Location: 753859
Implantable Lead Location: 753860
Implantable Lead Model: 5076
Implantable Lead Model: 5076
Implantable Pulse Generator Implant Date: 20100910
Lead Channel Impedance Value: 472 Ohm
Lead Channel Impedance Value: 657 Ohm
Lead Channel Pacing Threshold Amplitude: 0.75 V
Lead Channel Pacing Threshold Amplitude: 1.875 V
Lead Channel Pacing Threshold Pulse Width: 0.4 ms
Lead Channel Pacing Threshold Pulse Width: 0.4 ms
Lead Channel Setting Pacing Amplitude: 2 V
Lead Channel Setting Pacing Amplitude: 3.75 V
Lead Channel Setting Pacing Pulse Width: 0.4 ms
Lead Channel Setting Sensing Sensitivity: 5.6 mV

## 2021-03-04 NOTE — Progress Notes (Signed)
Remote pacemaker transmission.   

## 2021-05-27 ENCOUNTER — Ambulatory Visit (INDEPENDENT_AMBULATORY_CARE_PROVIDER_SITE_OTHER): Payer: Medicare Other

## 2021-05-27 DIAGNOSIS — I495 Sick sinus syndrome: Secondary | ICD-10-CM

## 2021-05-28 LAB — CUP PACEART REMOTE DEVICE CHECK
Battery Impedance: 2986 Ohm
Battery Remaining Longevity: 20 mo
Battery Voltage: 2.74 V
Brady Statistic AP VP Percent: 2 %
Brady Statistic AP VS Percent: 86 %
Brady Statistic AS VP Percent: 0 %
Brady Statistic AS VS Percent: 12 %
Date Time Interrogation Session: 20230524083743
Implantable Lead Implant Date: 20100910
Implantable Lead Implant Date: 20100910
Implantable Lead Location: 753859
Implantable Lead Location: 753860
Implantable Lead Model: 5076
Implantable Lead Model: 5076
Implantable Pulse Generator Implant Date: 20100910
Lead Channel Impedance Value: 478 Ohm
Lead Channel Impedance Value: 638 Ohm
Lead Channel Pacing Threshold Amplitude: 0.75 V
Lead Channel Pacing Threshold Amplitude: 1.875 V
Lead Channel Pacing Threshold Pulse Width: 0.4 ms
Lead Channel Pacing Threshold Pulse Width: 0.4 ms
Lead Channel Setting Pacing Amplitude: 2 V
Lead Channel Setting Pacing Amplitude: 3.75 V
Lead Channel Setting Pacing Pulse Width: 0.4 ms
Lead Channel Setting Sensing Sensitivity: 5.6 mV

## 2021-06-03 ENCOUNTER — Ambulatory Visit: Payer: Medicare Other | Admitting: Cardiovascular Disease

## 2021-06-03 ENCOUNTER — Encounter: Payer: Self-pay | Admitting: Cardiovascular Disease

## 2021-06-03 VITALS — BP 128/70 | HR 84 | Ht 67.0 in | Wt 139.0 lb

## 2021-06-03 DIAGNOSIS — I495 Sick sinus syndrome: Secondary | ICD-10-CM | POA: Diagnosis not present

## 2021-06-03 DIAGNOSIS — Z950 Presence of cardiac pacemaker: Secondary | ICD-10-CM | POA: Diagnosis not present

## 2021-06-03 DIAGNOSIS — I2510 Atherosclerotic heart disease of native coronary artery without angina pectoris: Secondary | ICD-10-CM

## 2021-06-03 DIAGNOSIS — E785 Hyperlipidemia, unspecified: Secondary | ICD-10-CM

## 2021-06-03 NOTE — Patient Instructions (Signed)
Medication Instructions:  Your physician recommends that you continue on your current medications as directed. Please refer to the Current Medication list given to you today.  *If you need a refill on your cardiac medications before your next appointment, please call your pharmacy*   Lab Work: None ordered If you have labs (blood work) drawn today and your tests are completely normal, you will receive your results only by: MyChart Message (if you have MyChart) OR A paper copy in the mail If you have any lab test that is abnormal or we need to change your treatment, we will call you to review the results.   Testing/Procedures: None ordered   Follow-Up: At [facility], you and your health needs are our priority.  As part of our continuing mission to provide you with exceptional heart care, we have created designated Provider Care Teams.  These Care Teams include your primary Cardiologist (physician) and Advanced Practice Providers (APPs -  Physician Assistants and Nurse Practitioners) who all work together to provide you with the care you need, when you need it.  We recommend signing up for the patient portal called "MyChart".  Sign up information is provided on this After Visit Summary.  MyChart is used to connect with patients for Virtual Visits (Telemedicine).  Patients are able to view lab/test results, encounter notes, upcoming appointments, etc.  Non-urgent messages can be sent to your provider as well.   To learn more about what you can do with MyChart, go to [email or web address].    Your next appointment:   6 month(s)  The format for your next appointment:   In Person  Provider:   You may see Muhammad Arida, MD or one of the following Advanced Practice Providers on your designated Care Team:   Christopher Berge, NP Ryan Dunn, PA-C Cadence Furth, PA-C    Other Instructions   Important Information About Sugar       

## 2021-06-03 NOTE — Progress Notes (Signed)
Cardiology Office Note   Date:  06/03/2021   ID:  [name], DOB Oct 28, 1927, MRN [id number]  PCP:  Barron Alvine, MD  Cardiologist:   Lorine Bears, MD   Chief Complaint  Patient presents with   Other    6 month f/u no complaints today. Meds reviewed verbally with pt.       History of Present Illness: [name] is a 88 y.o. male who presents for  a follow-up visit  regarding coronary artery disease.  He has known history of coronary artery disease status post CABG in 2001. Most recent cardiac catheterization in 2010 showed patent grafts with 80% ostial left circumflex stenosis. Subsequent nuclear stress test in 2011 showed no evidence of ischemia with normal ejection fraction. He is also status post dual-chamber pacemaker placement monitored by Dr. Graciela Husbands. He has chronic headache.  He had myalgia with atorvastatin and did not want to go on any other medications.   Unfortunately, his wife died last month after she suffered a major stroke late last year.  He lives by himself but he has family support.  He denies chest pain or shortness of breath.     Past Medical History:  Diagnosis Date   CAD (coronary artery disease)    s/p CABG in 2001; lexiscan 2011  no ischemia and nl  LV function   Dysrhythmia    Headache    headaches for 10 years-been to alot of physicians -cannot find problem   History of kidney stones    HLD (hyperlipidemia)    Hypertension    Myocardial infarction (HCC) 02/1999   Pacemaker    sees Dr. Graciela Husbands   Pneumonia 11/30/2017   Sinus node dysfunction [facility])    s/p PPM   Sleep apnea 04/20/2011   dx at [facility] not tolerate CPAP-does not use   Ventricular tachycardia, non-sustained [facility])     Past Surgical History:  Procedure Laterality Date   BLADDER STONE REMOVAL  2005   CARDIAC CATHETERIZATION  09/2008   patent grafts. 80% proximal stneosis in left circumflex artery (subsequent stress test showed no ischemia)   cataract surgery      bilateral   CORONARY ARTERY BYPASS GRAFT  03/1999   LIMA to LAD, SVG to RCA, SVG to ramus, sequential SVG to  first and second diagonal.   INSERT / REPLACE / REMOVE PACEMAKER     PACEMAKER INSERTION  09/13/2008   insertion pacemaker     Current Outpatient Medications  Medication Sig Dispense Refill   aspirin 81 MG tablet Take 81 mg by mouth at bedtime.      gabapentin (NEURONTIN) 600 MG tablet Take 600 mg by mouth 2 (two) times daily.      isosorbide mononitrate (IMDUR) 30 MG 24 hr tablet Take 1 tablet (30 mg total) by mouth daily. 90 tablet 3   metoprolol tartrate (LOPRESSOR) 25 MG tablet Take 1 tablet (25 mg total) by mouth 2 (two) times daily. 90 tablet 0   Multiple Vitamin (MULTIVITAMIN) tablet Take 1 tablet by mouth daily.       neomycin-polymyxin b-dexamethasone (MAXITROL) 3.5-10000-0.1 SUSP SMARTSIG:In Eye(s)     nitroGLYCERIN (NITROSTAT) 0.4 MG SL tablet Place 0.4 mg under the tongue every 5 (five) minutes as needed for chest pain.      omeprazole (PRILOSEC) 20 MG capsule Take 20 mg by mouth 2 (two) times daily before a meal.     ranolazine (RANEXA) 500 MG 12 hr tablet Take 1 tablet  by mouth twice daily 180 tablet 2   sertraline (ZOLOFT) 50 MG tablet TAKE 2 TABLETS BY MOUTH  DAILY (Patient taking differently: Take 50 mg by mouth daily. Take 2 tablets by mouth daily) 60 tablet 0   tamsulosin (FLOMAX) 0.4 MG CAPS capsule Take 0.4 mg by mouth daily.      traZODone (DESYREL) 100 MG tablet Take 100-150 mg by mouth at bedtime.  0   triamcinolone cream (KENALOG) 0.1 % Apply 1 application topically daily as needed (rash/itching).     vitamin B-12 (CYANOCOBALAMIN) 1000 MCG tablet Take 1,000 mcg by mouth daily.     No current facility-administered medications for this visit.    Allergies:   Patient has no known allergies.    Social History:  The patient  reports that he has never smoked. He has never used smokeless tobacco. He reports that he does not drink alcohol and does not use  drugs.   Family History:  The patient's family history includes Cancer in his mother; Heart attack in his brother, father, and another family member; Heart disease in his brother, father, and sister.    ROS:  Please see the history of present illness.   Otherwise, review of systems are positive for none.   All other systems are reviewed and negative.    PHYSICAL EXAM: VS:  BP 128/70 (BP Location: Left Arm, Patient Position: Sitting, Cuff Size: Normal)   Pulse 84   Ht 5\' 7"  (1.702 m)   Wt 139 lb (63 kg)   SpO2 93%   BMI 21.77 kg/m  , BMI Body mass index is 21.77 kg/m. GEN: Well nourished, well developed, in no acute distress  HEENT: normal  Neck: no JVD, carotid bruits, or masses Cardiac: RRR; no murmurs, rubs, or gallops,no edema  Respiratory:  clear to auscultation bilaterally, normal work of breathing GI: soft, nontender, nondistended, + BS MS: no deformity or atrophy  Skin: warm and dry, no rash Neuro:  Strength and sensation are intact Psych: euthymic mood, full affect   EKG:  EKG is ordered today. The ekg ordered today demonstrates atrial paced rhythm with no significant ST changes.  Moderate LVH.   Recent Labs: No results found for requested labs within last 8760 hours.    Lipid Panel    Component Value Date/Time   CHOL  09/12/2008 0145    133        ATP III CLASSIFICATION:  <200     mg/dL   Desirable  161-096200-239  mg/dL   Borderline High  >=045>=240    mg/dL   High          TRIG 75 09/12/2008 0145   HDL 43 09/12/2008 0145   CHOLHDL 3.1 09/12/2008 0145   VLDL 15 09/12/2008 0145   LDLCALC  09/12/2008 0145    75        Total Cholesterol/HDL:CHD Risk Coronary Heart Disease Risk Table                     Men   Women  1/2 Average Risk   3.4   3.3  Average Risk       5.0   4.4  2 X Average Risk   9.6   7.1  3 X Average Risk  23.4   11.0        Use the calculated Patient Ratio above and the CHD Risk Table to determine the patient's CHD Risk.        ATP III  CLASSIFICATION (LDL):  <100     mg/dL   Optimal  381-829  mg/dL   Near or Above                    Optimal  130-159  mg/dL   Borderline  937-169  mg/dL   High  >678     mg/dL   Very High      Wt Readings from Last 3 Encounters:  06/03/21 139 lb (63 kg)  11/13/20 141 lb 8 oz (64.2 kg)  01/29/20 143 lb 6 oz (65 kg)        ASSESSMENT AND PLAN:  1. Coronary artery disease involving native coronary arteries with stable angina: He has stable angina controlled with Ranexa, Imdur and metoprolol.    2. Hyperlipidemia: The patient is intolerant to statins and most recently stopped atorvastatin.  I have discussed alternatives with the patient in the past and he was not interested.  I do not think that is unreasonable given his age.  3. Status post dual-chamber pacemaker placement: Followed by Dr. Graciela Husbands.   Disposition:   FU with me in 6 months  Signed,  Lorine Bears, MD  06/03/2021 2:41 PM    [facility]

## 2021-06-09 NOTE — Progress Notes (Signed)
Remote pacemaker transmission.   

## 2021-06-18 IMAGING — CT CT SHOULDER*L* W/CM
1 series · 12 of 14 positions shown, 15 images · IV contrast (agent unspecified)
Comparison: None.

CLINICAL DATA: Left shoulder pain

EXAM:
CT OF THE UPPER LEFT EXTREMITY WITH CONTRAST
TECHNIQUE: Multidetector CT imaging of the upper left extremity was performed
according to the standard protocol following intra-articular
contrast administration.
CONTRAST:  Intra-articular contrast

[Series 3: soft tissue (person_name) · axial · 0.45mm/px · z∈[-192,-38]mm · 12 of 92 slices shown, 15 images]
[im 8/92  soft-tissue]
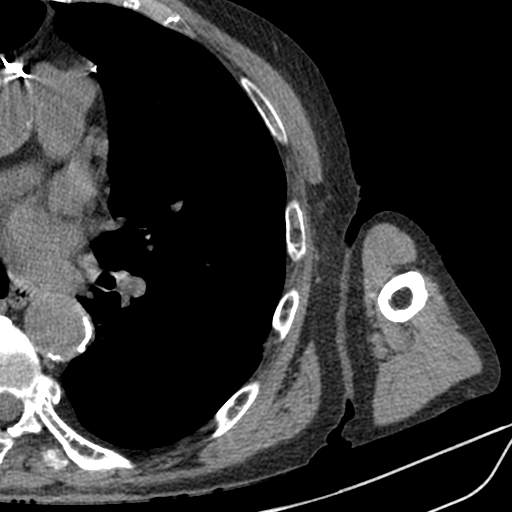
[im 8/92  bone]
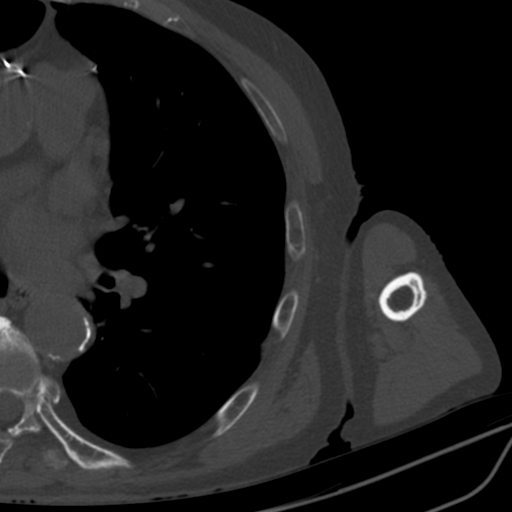
[im 15/92  bone]
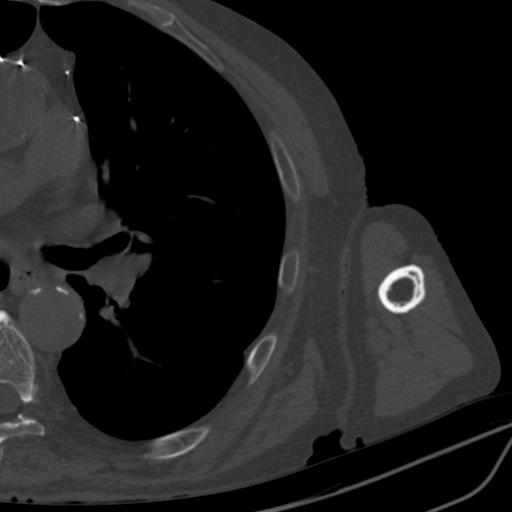
[im 22/92  bone]
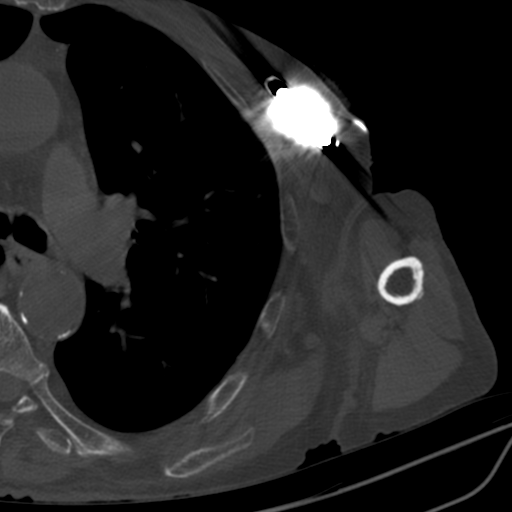
[im 29/92  bone]
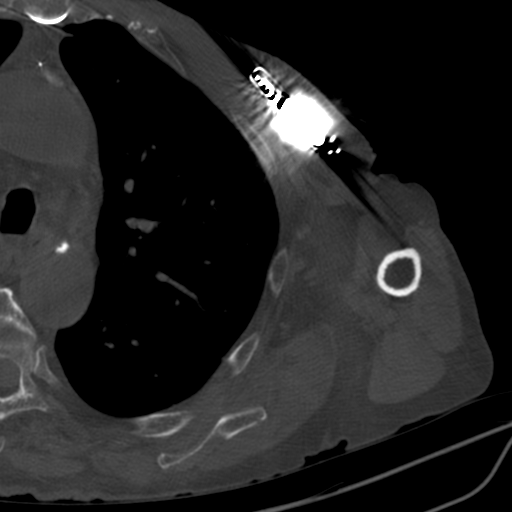
[im 36/92  soft-tissue]
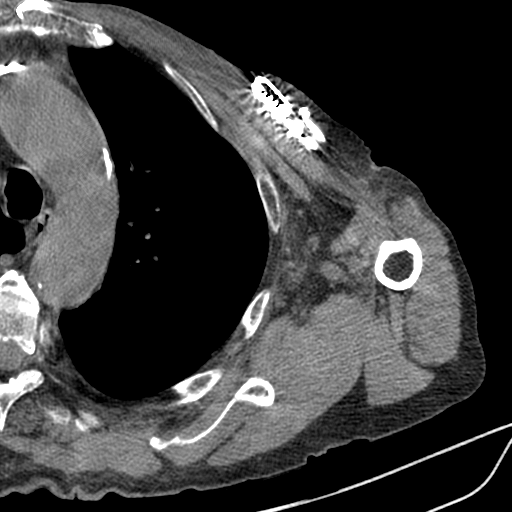
[im 36/92  bone]
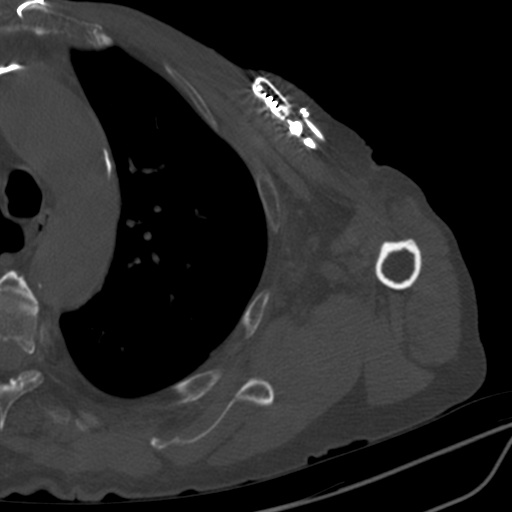
[im 43/92  bone]
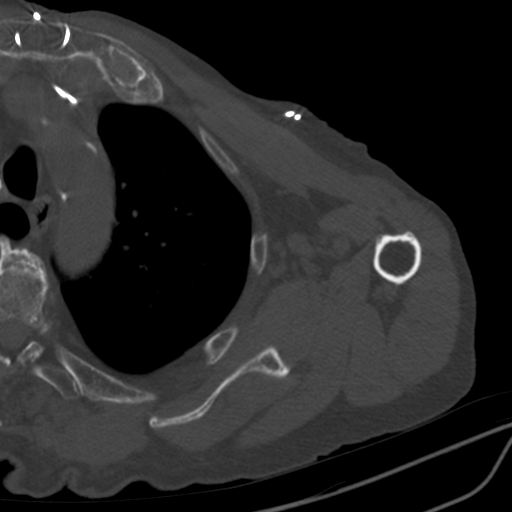
[im 50/92  bone]
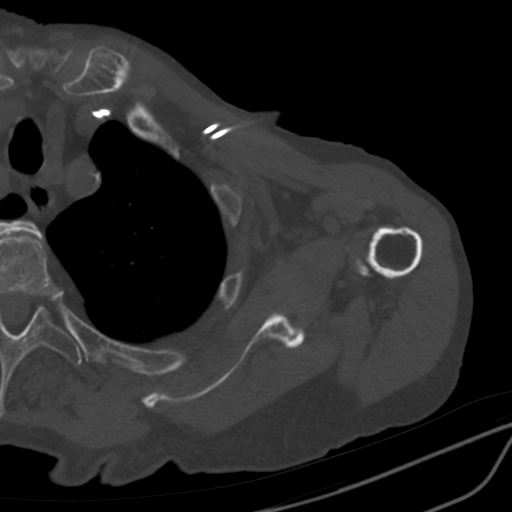
[im 57/92  bone]
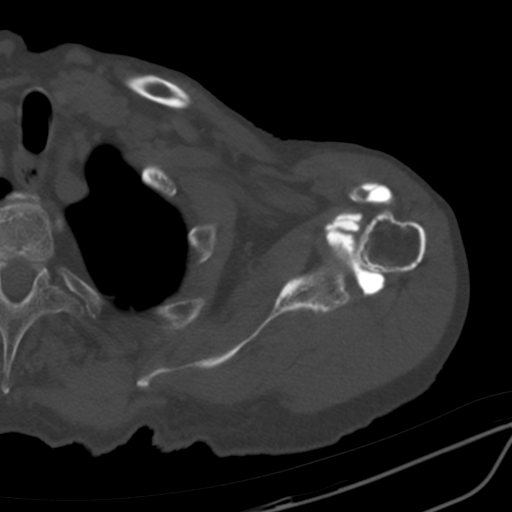
[im 64/92  soft-tissue]
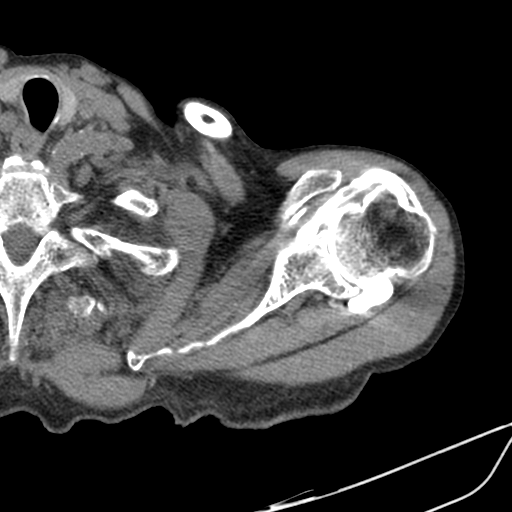
[im 64/92  bone]
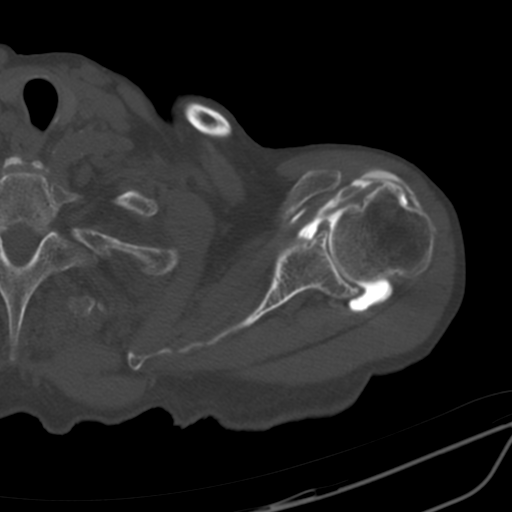
[im 71/92  bone]
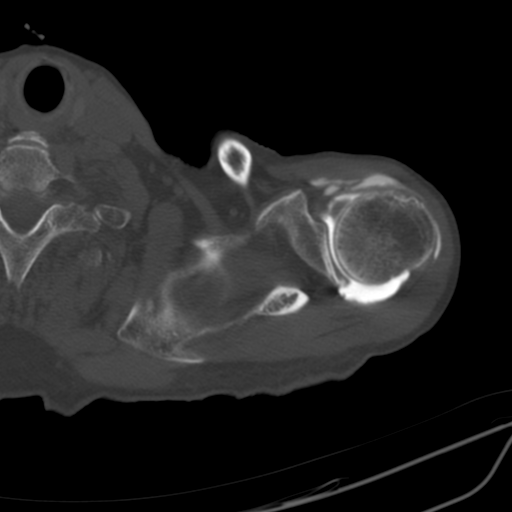
[im 78/92  bone]
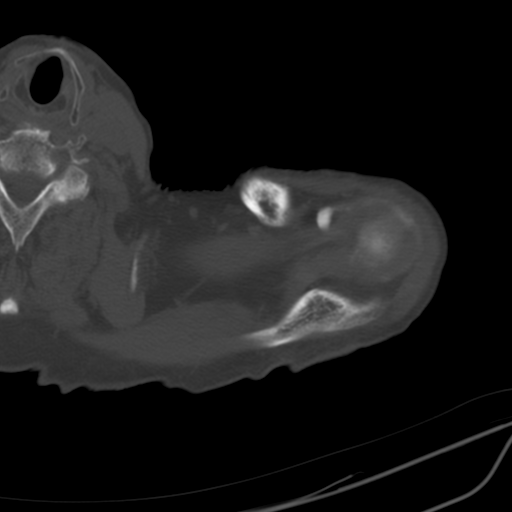
[im 85/92  bone]
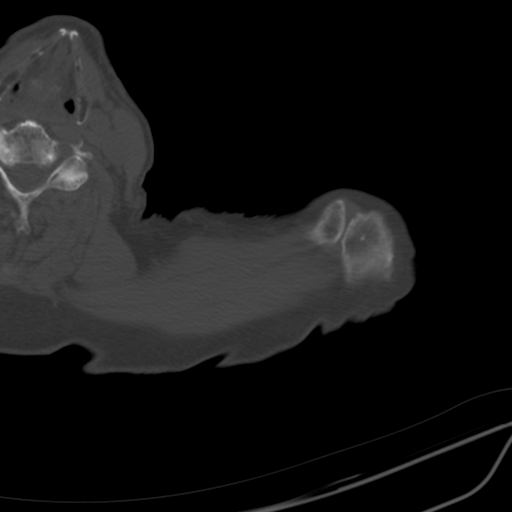

[12 of 14 positions shown; findings below may reference images not displayed]

FINDINGS: Bones/Joint/Cartilage

No fracture or dislocation. There is moderate glenohumeral joint
osteoarthritis with diffuse chondral thinning. There is adequate
distention of contrast seen within the glenohumeral joint. No debris
seen within the glenohumeral joint. There is attenuation of the
superior labrum. No displaced labral fragment however is noted.

Ligaments

Suboptimally assessed by CT.

Muscles and Tendons

There is a small 5 mm focal full-thickness tear seen of the superior
subscapularis tendon at the insertion site. There is medial
subluxation of the long head of the biceps tendon. The biceps labral
complex, however appears to be intact. There is also partial
low-grade bursal surface tear seen at the anterior supraspinatus
tendon at the insertion site. The teres minor and infraspinatus
tendons appear to be intact. The muscles surrounding the shoulder
appear to be intact. No evidence of muscular atrophy or focal tear.

Soft tissues

No focal soft tissue swelling or soft tissue mass. No axillary
adenopathy. Visualized portion of the lung is clear. There is
scattered atherosclerosis. A left-sided pacemaker is seen. There is
mild AC joint arthrosis with capsular hypertrophy. Fluid in the
subacromial-subdeltoid bursa is seen.
IMPRESSION: 1. Moderate glenohumeral joint osteoarthritis with superior labral
degeneration
2. Focal full-thickness tear of the superior subscapularis tendon
with medial subluxation of the long head of the biceps tendon.
3. Partial low-grade bursal surface tear of the anterior
supraspinatus tendon at the insertion site.
4. Mild AC joint arthrosis.

## 2021-08-26 ENCOUNTER — Ambulatory Visit (INDEPENDENT_AMBULATORY_CARE_PROVIDER_SITE_OTHER): Payer: Medicare Other

## 2021-08-26 DIAGNOSIS — I495 Sick sinus syndrome: Secondary | ICD-10-CM | POA: Diagnosis not present

## 2021-08-27 LAB — CUP PACEART REMOTE DEVICE CHECK
Battery Impedance: 3054 Ohm
Battery Remaining Longevity: 23 mo
Battery Voltage: 2.74 V
Brady Statistic AP VP Percent: 2 %
Brady Statistic AP VS Percent: 87 %
Brady Statistic AS VP Percent: 0 %
Brady Statistic AS VS Percent: 11 %
Date Time Interrogation Session: 20230823082019
Implantable Lead Implant Date: 20100910
Implantable Lead Implant Date: 20100910
Implantable Lead Location: 753859
Implantable Lead Location: 753860
Implantable Lead Model: 5076
Implantable Lead Model: 5076
Implantable Pulse Generator Implant Date: 20100910
Lead Channel Impedance Value: 516 Ohm
Lead Channel Impedance Value: 757 Ohm
Lead Channel Pacing Threshold Amplitude: 0.875 V
Lead Channel Pacing Threshold Amplitude: 1.625 V
Lead Channel Pacing Threshold Pulse Width: 0.4 ms
Lead Channel Pacing Threshold Pulse Width: 0.4 ms
Lead Channel Setting Pacing Amplitude: 2 V
Lead Channel Setting Pacing Amplitude: 3.25 V
Lead Channel Setting Pacing Pulse Width: 0.4 ms
Lead Channel Setting Sensing Sensitivity: 5.6 mV

## 2021-09-14 ENCOUNTER — Other Ambulatory Visit: Payer: Self-pay | Admitting: Cardiovascular Disease

## 2021-09-14 DIAGNOSIS — Z950 Presence of cardiac pacemaker: Secondary | ICD-10-CM

## 2021-09-14 DIAGNOSIS — R001 Bradycardia, unspecified: Secondary | ICD-10-CM

## 2021-09-22 NOTE — Progress Notes (Signed)
Remote pacemaker transmission.   

## 2021-10-23 ENCOUNTER — Telehealth: Payer: Self-pay | Admitting: Cardiovascular Disease

## 2021-10-23 ENCOUNTER — Telehealth: Payer: Self-pay | Admitting: *Deleted

## 2021-10-23 ENCOUNTER — Ambulatory Visit: Payer: Medicare Other | Attending: General Practice | Admitting: General Practice

## 2021-10-23 DIAGNOSIS — Z01810 Encounter for preprocedural cardiovascular examination: Secondary | ICD-10-CM | POA: Diagnosis not present

## 2021-10-23 NOTE — Telephone Encounter (Signed)
   Name: [name]  DOB: 1927-04-18  MRN: [id number]  Primary Cardiologist: Kathlyn Sacramento, MD   Preoperative team, please contact this patient and set up a phone call appointment for further preoperative risk assessment. Please obtain consent and complete medication review. Thank you for your help.  I confirm that guidance regarding antiplatelet and oral anticoagulation therapy has been completed and, if necessary, noted below.  Patient's aspirin was prescribed by a noncardiology provider.  Recommendations for holding aspirin will need to come from prescribing provider.   Deberah Pelton, NP 10/23/2021, 12:06 PM [place]

## 2021-10-23 NOTE — Telephone Encounter (Signed)
I s/w the pt and his wife who are agreeable to add on tele pre op appt today for ASA hold time for procedure 10/30/21.    Med rec and consent are done.

## 2021-10-23 NOTE — Telephone Encounter (Signed)
   Pre-operative Risk Assessment    Patient Name: [name]  DOB: Sep 02, 1927 MRN: [id number]      Request for Surgical Clearance    Procedure:  ectropion repair of right lower lid, entropion repair of right upper lid  Date of Surgery:  Clearance 10/30/21                                 Surgeon:  Dr. Delmar Landau Group or Practice Name:  [facility] Phone number:  [phone] Fax number:  [phone]   Type of Clearance Requested:   - Medical  - Pharmacy:  Hold Aspirin 5-7 days   Type of Anesthesia:  topical with IV sedation    Additional requests/questions:    Signed, Selena Zobro   10/23/2021, 11:50 AM

## 2021-10-23 NOTE — Progress Notes (Signed)
Virtual Visit via Telephone Note   Because of [name]'s co-morbid illnesses, he is at least at moderate risk for complications without adequate follow up.  This format is felt to be most appropriate for this patient at this time.  The patient did not have access to video technology/had technical difficulties with video requiring transitioning to audio format only (telephone).  All issues noted in this document were discussed and addressed.  No physical exam could be performed with this format.  Please refer to the patient's chart for his consent to telehealth for [facility].  Evaluation Performed:  Preoperative cardiovascular risk assessment _____________   Date:  10/23/2021   Patient ID:  [name], DOB 1927/10/20, MRN [id number] Patient Location:  Home Provider location:   Office  Primary Care Provider:  Jene Every, MD Primary Cardiologist:  Kathlyn Sacramento, MD  Chief Complaint / Patient Profile   88 y.o. y/o male with a h/o CAD, SA node dysfunction, HLD, and PPM who is pending ectropion repair of right lower lid, entropion repair of right upper lid   and presents today for telephonic preoperative cardiovascular risk assessment.  Past Medical History    Past Medical History:  Diagnosis Date   CAD (coronary artery disease)    s/p CABG in 2001; lexiscan 2011  no ischemia and nl  LV function   Dysrhythmia    Headache    headaches for 10 years-been to alot of physicians -cannot find problem   History of kidney stones    HLD (hyperlipidemia)    Hypertension    Myocardial infarction ([place]) 02/1999   Pacemaker    sees Dr. Caryl Comes   Pneumonia 11/30/2017   Sinus node dysfunction [facility])    s/p PPM   Sleep apnea 04/20/2011   dx at [facility] not tolerate CPAP-does not use   Ventricular tachycardia, non-sustained [facility])    Past Surgical History:  Procedure Laterality Date   BLADDER STONE REMOVAL  2005   CARDIAC CATHETERIZATION  09/2008   patent  grafts. 80% proximal stneosis in left circumflex artery (subsequent stress test showed no ischemia)   cataract surgery     bilateral   CORONARY ARTERY BYPASS GRAFT  03/1999   LIMA to LAD, SVG to RCA, SVG to ramus, sequential SVG to  first and second diagonal.   INSERT / REPLACE / [place]  09/13/2008   insertion pacemaker    Allergies  No Known Allergies  History of Present Illness    [name] is a 88 y.o. male who presents via audio/video conferencing for a telehealth visit today.  Pt was last seen in cardiology clinic on 06/03/2021 by Dr. Fletcher Anon.  At that time [name] was doing well .  The patient is now pending procedure as outlined above. Since his last visit, he remains stable from a cardiac standpoint.   Today he denies chest pain, shortness of breath, lower extremity edema, fatigue, palpitations, melena, hematuria, hemoptysis, diaphoresis, weakness, presyncope, syncope, orthopnea, and PND.   Home Medications    Prior to Admission medications   Medication Sig Start Date End Date Taking? Authorizing Provider  aspirin 81 MG tablet Take 81 mg by mouth at bedtime.     [provider]  gabapentin (NEURONTIN) 600 MG tablet Take 600 mg by mouth 2 (two) times daily.     [provider]  isosorbide mononitrate (IMDUR) 30 MG 24 hr tablet Take 1 tablet (  30 mg total) by mouth daily. 01/29/20 10/23/21  Deboraha Sprang, MD  metoprolol tartrate (LOPRESSOR) 25 MG tablet Take 1 tablet (25 mg total) by mouth 2 (two) times daily. 05/25/11   Deboraha Sprang, MD  Multiple Vitamin (MULTIVITAMIN) tablet Take 1 tablet by mouth daily.      [provider]  neomycin-polymyxin b-dexamethasone (MAXITROL) 3.5-10000-0.1 SUSP SMARTSIG:In Eye(s) 11/07/19   [provider]  nitroGLYCERIN (NITROSTAT) 0.4 MG SL tablet Place 0.4 mg under the tongue every 5 (five) minutes as needed for chest pain.     [provider]  omeprazole  (PRILOSEC) 20 MG capsule Take 20 mg by mouth 2 (two) times daily before a meal.    [provider]  ranolazine (RANEXA) 500 MG 12 hr tablet Take 1 tablet by mouth twice daily 09/14/21   Wellington Hampshire, MD  sertraline (ZOLOFT) 50 MG tablet TAKE 2 TABLETS BY MOUTH  DAILY Patient taking differently: Take 50 mg by mouth daily. Take 2 tablets by mouth daily 09/28/19   Pieter Partridge, DO  tamsulosin (FLOMAX) 0.4 MG CAPS capsule Take 0.4 mg by mouth daily.  08/25/18   [provider]  traZODone (DESYREL) 100 MG tablet Take 100-150 mg by mouth at bedtime. 11/20/13   [provider]  triamcinolone cream (KENALOG) 0.1 % Apply 1 application topically daily as needed (rash/itching).    [provider]  vitamin B-12 (CYANOCOBALAMIN) 1000 MCG tablet Take 1,000 mcg by mouth daily.    [provider]    Physical Exam    Vital Signs:  [name] does not have vital signs available for review today.  Given telephonic nature of communication, physical exam is limited. AAOx3. NAD. Normal affect.  Speech and respirations are unlabored.  Accessory Clinical Findings    None  Assessment & Plan    1.  Preoperative Cardiovascular Risk Assessment:-Ectropion repair of right lower lid, ectropion repair of right upper lid, [facility], Dr. Hollice Espy      Primary Cardiologist: Kathlyn Sacramento, MD  Chart reviewed as part of pre-operative protocol coverage. Given past medical history and time since last visit, based on ACC/AHA guidelines, [name] would be at acceptable risk for the planned procedure without further cardiovascular testing.   Patient was advised that if he develops new symptoms prior to surgery to contact our office to arrange a follow-up appointment.  He verbalized understanding.  I will route this recommendation to the requesting party via Epic fax function and remove from pre-op pool.  His aspirin may be held for 5-7 days prior to his  procedure.  Please resume as soon as hemostasis is achieved.   Time:   Today, I have spent 10 minutes with the patient with telehealth technology discussing medical history, symptoms, and management plan.  Prior to his phone evaluation I spent greater than 10 minutes reviewing his past medical history and cardiac medications.   Deberah Pelton, NP  10/23/2021, 1:50 PM

## 2021-10-23 NOTE — Telephone Encounter (Signed)
I s/w the pt and his wife who are agreeable to add on tele pre op appt today for ASA hold time for procedure 10/30/21.   Med rec and consent are done.     Patient Consent for Virtual Visit        [name] has provided verbal consent on 10/23/2021 for a virtual visit (video or telephone).   CONSENT FOR VIRTUAL VISIT FOR:  [name]  By participating in this virtual visit I agree to the following:  I hereby voluntarily request, consent and authorize [facility] and its employed or contracted physicians, physician assistants, nurse practitioners or other licensed health care professionals (the Practitioner), to provide me with telemedicine health care services (the "Services") as deemed necessary by the treating Practitioner. I acknowledge and consent to receive the Services by the Practitioner via telemedicine. I understand that the telemedicine visit will involve communicating with the Practitioner through live audiovisual communication technology and the disclosure of certain medical information by electronic transmission. I acknowledge that I have been given the opportunity to request an in-person assessment or other available alternative prior to the telemedicine visit and am voluntarily participating in the telemedicine visit.  I understand that I have the right to withhold or withdraw my consent to the use of telemedicine in the course of my care at any time, without affecting my right to future care or treatment, and that the Practitioner or I may terminate the telemedicine visit at any time. I understand that I have the right to inspect all information obtained and/or recorded in the course of the telemedicine visit and may receive copies of available information for a reasonable fee.  I understand that some of the potential risks of receiving the Services via telemedicine include:  Delay or interruption in medical evaluation due to technological equipment failure or  disruption; Information transmitted may not be sufficient (e.g. poor resolution of images) to allow for appropriate medical decision making by the Practitioner; and/or  In rare instances, security protocols could fail, causing a breach of personal health information.  Furthermore, I acknowledge that it is my responsibility to provide information about my medical history, conditions and care that is complete and accurate to the best of my ability. I acknowledge that Practitioner's advice, recommendations, and/or decision may be based on factors not within their control, such as incomplete or inaccurate data provided by me or distortions of diagnostic images or specimens that may result from electronic transmissions. I understand that the practice of medicine is not an exact science and that Practitioner makes no warranties or guarantees regarding treatment outcomes. I acknowledge that a copy of this consent can be made available to me via my patient portal ([facility]), or I can request a printed copy by calling the office of [facility].    I understand that my insurance will be billed for this visit.   I have read or had this consent read to me. I understand the contents of this consent, which adequately explains the benefits and risks of the Services being provided via telemedicine.  I have been provided ample opportunity to ask questions regarding this consent and the Services and have had my questions answered to my satisfaction. I give my informed consent for the services to be provided through the use of telemedicine in my medical care

## 2021-10-26 ENCOUNTER — Telehealth: Payer: Medicare Other

## 2021-10-26 NOTE — Telephone Encounter (Signed)
Gene with [facility] calling to follow up on clearance. Fax: [phone]

## 2021-10-26 NOTE — Telephone Encounter (Signed)
Left message for Gene with [facility]. Clearance notes were faxed on Friday 10/23/21. I will re-fax the notes from Coletta Memos, FNP

## 2021-10-29 ENCOUNTER — Other Ambulatory Visit: Payer: Self-pay

## 2021-10-29 ENCOUNTER — Encounter (HOSPITAL_COMMUNITY): Payer: Self-pay | Admitting: Optometry

## 2021-10-29 NOTE — Pre-Procedure Instructions (Signed)
PCP - Dr. Jene Every Cardiologist - Dr. Kathlyn Sacramento  PPM/ICD - PPM Device Orders - electronically sent Rep Notified - [place]  Chest x-ray - 11/08/20 CE EKG - 06/03/21 Stress Test - 08/25/09 ECHO - 09/11/08 Cardiac Cath - 09/2008  CPAP - OSA+, pt no longer uses CPAP  DM- denies  ASA/Blood Thinner Instructions: n/a (Pt states he quit taking ASA 81mg  1 week ago)   ERAS Protcol - clears 0915  COVID TEST- n/a  Anesthesia review: yes, cardiac hx. PPM   Patient verbally denies any shortness of breath, fever, cough and chest pain during phone call   -------------  SDW INSTRUCTIONS given:  Your procedure is scheduled on 10/27.  Report to [facility] Main Entrance "A" at 09:45 A.M., and check in at the Admitting office.  Call this number if you have problems the morning of surgery:  [phone]   Remember:  Do not eat after midnight the night before your surgery  You may drink clear liquids until 09:15 AM the morning of your surgery.   Clear liquids allowed are: Water, Non-Citrus Juices (without pulp), Carbonated Beverages, Clear Tea, Black Coffee Only, and Gatorade    Take these medicines the morning of surgery with A SIP OF WATER  Gabapentin, Imdur, Metoprolol, Nitro PRN, Omeprazole, Ranexa, Zoloft, Flomax  As of today, STOP taking any Aspirin (unless otherwise instructed by your surgeon) Aleve, Naproxen, Ibuprofen, Motrin, Advil, Goody's, BC's, all herbal medications, fish oil, and all vitamins.                      Do not wear jewelry            Do not wear lotions, powders, perfumes, or deodorant.            Do not shave 48 hours prior to surgery.              Do not bring valuables to the hospital.            [facility] is not responsible for any belongings or valuables.  Do NOT Smoke (Tobacco/Vaping) 24 hours prior to your procedure If you use a CPAP at night, you may bring all equipment for your overnight stay.   Contacts, glasses, dentures or  bridgework may not be worn into surgery.      For patients admitted to the hospital, discharge time will be determined by your treatment team.   Patients discharged the day of surgery will not be allowed to drive home, and someone needs to stay with them for 24 hours.    Special instructions:   [facility]- Preparing For Surgery  Before surgery, you can play an important role. Because skin is not sterile, your skin needs to be as free of germs as possible. You can reduce the number of germs on your skin by washing with CHG (chlorahexidine gluconate) Soap before surgery.  CHG is an antiseptic cleaner which kills germs and bonds with the skin to continue killing germs even after washing.    Oral Hygiene is also important to reduce your risk of infection.  Remember - BRUSH YOUR TEETH THE MORNING OF SURGERY WITH YOUR REGULAR TOOTHPASTE  Please do not use if you have an allergy to CHG or antibacterial soaps. If your skin becomes reddened/irritated stop using the CHG.  Do not shave (including legs and underarms) for at least 48 hours prior to first CHG shower. It is OK to shave your face.  Please follow these instructions carefully.   Shower the NIGHT BEFORE SURGERY and the MORNING OF SURGERY with DIAL Soap.   Pat yourself dry with a CLEAN TOWEL.  Wear CLEAN PAJAMAS to bed the night before surgery  Place CLEAN SHEETS on your bed the night of your first shower and DO NOT SLEEP WITH PETS.   Day of Surgery: Please shower morning of surgery  Wear Clean/Comfortable clothing the morning of surgery Do not apply any deodorants/lotions.   Remember to brush your teeth WITH YOUR REGULAR TOOTHPASTE.   Questions were answered. Patient verbalized understanding of instructions.

## 2021-10-29 NOTE — Progress Notes (Signed)
Email sent to rep letting them know they will need to be present on day of surgery for device interrogation

## 2021-10-29 NOTE — Anesthesia Preprocedure Evaluation (Addendum)
Anesthesia Evaluation  Patient identified by MRN, date of birth, ID band Patient awake    Reviewed: Allergy & Precautions, NPO status , Patient's Chart, lab work & pertinent test results  Airway Mallampati: III  TM Distance: >3 FB Neck ROM: Full    Dental no notable dental hx.    Pulmonary sleep apnea    Pulmonary exam normal        Cardiovascular hypertension, Pt. 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 entropian right upper eye lid    Reproductive/Obstetrics                             Anesthesia Physical Anesthesia Plan  ASA: 3  Anesthesia Plan: MAC   Post-op Pain Management:    Induction: Intravenous  PONV Risk Score and Plan: 1 and Ondansetron, Dexamethasone and Treatment may vary due to age or medical condition  Airway Management Planned: Nasal Cannula  Additional Equipment:   Intra-op Plan:   Post-operative Plan:   Informed Consent: I have reviewed the patients History and Physical, chart, labs and discussed the procedure including the risks, benefits and alternatives for the proposed anesthesia with the patient or authorized representative who has indicated his/her understanding and acceptance.     Dental advisory given  Plan Discussed with: CRNA and Surgeon  Anesthesia Plan Comments: (PAT note by Karoline Caldwell, PA-C:  Follows with cardiology for history of CAD s/p CABG 2001, SA node dysfunction s/p Medtronic PPM,  HLD. Most recent cardiac catheterization in 2010 showed patent grafts with 80% ostial left circumflex stenosis. Subsequent nuclear stress test in 2011 showed no evidence of ischemia with normal ejection fraction. Seen by Coletta Memos, NP 10/23/2021 for preop evaluation.  Per note, "Chart reviewed as part of pre-operative protocol coverage. Given past medical history and time since last visit, based on ACC/AHA guidelines,Blase A Hoganwould be at acceptable risk for the planned procedure without further cardiovascular testing."  Cardiac device form sent to EP.  OSA, intolerant to CPAP.  Patient will need day of surgery labs and evaluation.  EKG 06/03/2021: Atrial paced rhythm.  LVH.  No sniffing changes. )        Anesthesia Quick Evaluation

## 2021-10-29 NOTE — Progress Notes (Signed)
Anesthesia Chart Review: Same-day work-up  Follows with cardiology for history of CAD s/p CABG 2001, SA node dysfunction s/p Medtronic PPM, HLD. Most recent cardiac catheterization in 2010 showed patent grafts with 80% ostial left circumflex stenosis. Subsequent nuclear stress test in 2011 showed no evidence of ischemia with normal ejection fraction. Seen by [name] Memos, NP 10/23/2021 for preop evaluation.  Per note, "Chart reviewed as part of pre-operative protocol coverage. Given past medical history and time since last visit, based on ACC/AHA guidelines, [name] would be at acceptable risk for the planned procedure without further cardiovascular testing."  Cardiac device form sent to EP.  OSA, intolerant to CPAP.  Patient will need day of surgery labs and evaluation.  EKG 06/03/2021: Atrial paced rhythm.  LVH.  No sniffing changes.    [name] [facility] Short Stay Center/Anesthesiology Phone [phone] 10/29/2021 10:26 AM

## 2021-10-30 ENCOUNTER — Ambulatory Visit (HOSPITAL_COMMUNITY)
Admission: RE | Admit: 2021-10-30 | Discharge: 2021-10-30 | Disposition: A | Discharge status: Home / Self Care | Payer: Medicare Other | Attending: Optometry | Admitting: Optometry

## 2021-10-30 ENCOUNTER — Other Ambulatory Visit: Payer: Self-pay

## 2021-10-30 ENCOUNTER — Encounter (HOSPITAL_COMMUNITY)
Admission: RE | Disposition: A | Discharge status: Home / Self Care | Payer: Self-pay | Source: Home / Self Care | Attending: Optometry

## 2021-10-30 ENCOUNTER — Ambulatory Visit (HOSPITAL_BASED_OUTPATIENT_CLINIC_OR_DEPARTMENT_OTHER): Payer: Medicare Other | Admitting: Physician Assistant

## 2021-10-30 ENCOUNTER — Ambulatory Visit (HOSPITAL_COMMUNITY): Payer: Medicare Other | Admitting: Physician Assistant

## 2021-10-30 DIAGNOSIS — H02132 Senile ectropion of right lower eyelid: Secondary | ICD-10-CM

## 2021-10-30 DIAGNOSIS — F32A Depression, unspecified: Secondary | ICD-10-CM | POA: Diagnosis not present

## 2021-10-30 DIAGNOSIS — K219 Gastro-esophageal reflux disease without esophagitis: Secondary | ICD-10-CM | POA: Diagnosis not present

## 2021-10-30 DIAGNOSIS — M1990 Unspecified osteoarthritis, unspecified site: Secondary | ICD-10-CM

## 2021-10-30 DIAGNOSIS — H02032 Senile entropion of right lower eyelid: Secondary | ICD-10-CM | POA: Diagnosis not present

## 2021-10-30 DIAGNOSIS — H02001 Unspecified entropion of right upper eyelid: Secondary | ICD-10-CM | POA: Diagnosis present

## 2021-10-30 DIAGNOSIS — I2510 Atherosclerotic heart disease of native coronary artery without angina pectoris: Secondary | ICD-10-CM | POA: Diagnosis not present

## 2021-10-30 DIAGNOSIS — Z951 Presence of aortocoronary bypass graft: Secondary | ICD-10-CM | POA: Diagnosis not present

## 2021-10-30 DIAGNOSIS — I252 Old myocardial infarction: Secondary | ICD-10-CM | POA: Diagnosis not present

## 2021-10-30 DIAGNOSIS — I10 Essential (primary) hypertension: Secondary | ICD-10-CM | POA: Diagnosis not present

## 2021-10-30 DIAGNOSIS — Z79899 Other long term (current) drug therapy: Secondary | ICD-10-CM | POA: Diagnosis not present

## 2021-10-30 DIAGNOSIS — G4730 Sleep apnea, unspecified: Secondary | ICD-10-CM | POA: Diagnosis not present

## 2021-10-30 HISTORY — DX: Unspecified osteoarthritis, unspecified site: M19.90

## 2021-10-30 HISTORY — DX: Depression, unspecified: F32.A

## 2021-10-30 HISTORY — DX: Gastro-esophageal reflux disease without esophagitis: K21.9

## 2021-10-30 HISTORY — PX: ECTROPION REPAIR: SHX357

## 2021-10-30 HISTORY — PX: ENTROPIAN REPAIR: SHX1512

## 2021-10-30 LAB — CBC
HCT: 33 % — ABNORMAL LOW (ref 39.0–52.0)
Hemoglobin: 10.6 g/dL — ABNORMAL LOW (ref 13.0–17.0)
MCH: 28.8 pg (ref 26.0–34.0)
MCHC: 32.1 g/dL (ref 30.0–36.0)
MCV: 89.7 fL (ref 80.0–100.0)
Platelets: 169 10*3/uL (ref 150–400)
RBC: 3.68 MIL/uL — ABNORMAL LOW (ref 4.22–5.81)
RDW: 16.6 % — ABNORMAL HIGH (ref 11.5–15.5)
WBC: 7.6 10*3/uL (ref 4.0–10.5)
nRBC: 0 % (ref 0.0–0.2)

## 2021-10-30 LAB — BASIC METABOLIC PANEL
Anion gap: 13 (ref 5–15)
BUN: 17 mg/dL (ref 8–23)
CO2: 25 mmol/L (ref 22–32)
Calcium: 9.2 mg/dL (ref 8.9–10.3)
Chloride: 105 mmol/L (ref 98–111)
Creatinine, Ser: 1.36 mg/dL — ABNORMAL HIGH (ref 0.61–1.24)
GFR, Estimated: 49 mL/min — ABNORMAL LOW (ref >60)
Glucose, Bld: 108 mg/dL — ABNORMAL HIGH (ref 70–99)
Potassium: 4.7 mmol/L (ref 3.5–5.1)
Sodium: 143 mmol/L (ref 135–145)

## 2021-10-30 SURGERY — REPAIR, ECTROPION, EYELID
Anesthesia: Monitor Anesthesia Care | Site: Eye | Laterality: Right

## 2021-10-30 MED ORDER — STERILE WATER FOR IRRIGATION IR SOLN
Status: DC | PRN
  Administered 2021-10-30: 1000 mL

## 2021-10-30 MED ORDER — CHLORHEXIDINE GLUCONATE 0.12 % MT SOLN
OROMUCOSAL | Status: AC
  Administered 2021-10-30: 15 mL via OROMUCOSAL
  Filled 2021-10-30: qty 15

## 2021-10-30 MED ORDER — NEOMYCIN-POLYMYXIN-DEXAMETH 3.5-10000-0.1 OP OINT
TOPICAL_OINTMENT | OPHTHALMIC | Status: AC
  Filled 2021-10-30: qty 3.5

## 2021-10-30 MED ORDER — SODIUM CHLORIDE 0.9 % IV SOLN: INTRAVENOUS | Status: DC

## 2021-10-30 MED ORDER — LIDOCAINE-EPINEPHRINE 1 %-1:100000 IJ SOLN
INTRAMUSCULAR | Status: DC | PRN
  Administered 2021-10-30: 2.5 mL

## 2021-10-30 MED ORDER — ROCURONIUM BROMIDE 10 MG/ML (PF) SYRINGE
PREFILLED_SYRINGE | INTRAVENOUS | Status: AC
  Filled 2021-10-30: qty 10

## 2021-10-30 MED ORDER — LACTATED RINGERS IV SOLN: INTRAVENOUS | Status: DC | PRN

## 2021-10-30 MED ORDER — ACETAMINOPHEN 500 MG PO TABS: 1000.0000 mg | ORAL_TABLET | Freq: Once | ORAL | Status: DC

## 2021-10-30 MED ORDER — ERYTHROMYCIN 5 MG/GM OP OINT
TOPICAL_OINTMENT | OPHTHALMIC | Status: AC
  Filled 2021-10-30: qty 3.5

## 2021-10-30 MED ORDER — 0.9 % SODIUM CHLORIDE (POUR BTL) OPTIME
TOPICAL | Status: DC | PRN
  Administered 2021-10-30: 1000 mL

## 2021-10-30 MED ORDER — PROPOFOL 1000 MG/100ML IV EMUL
INTRAVENOUS | Status: AC
  Filled 2021-10-30: qty 100

## 2021-10-30 MED ORDER — PROPOFOL 500 MG/50ML IV EMUL
INTRAVENOUS | Status: DC | PRN
  Administered 2021-10-30: 25 ug/kg/min via INTRAVENOUS

## 2021-10-30 MED ORDER — LIDOCAINE 2% (20 MG/ML) 5 ML SYRINGE
INTRAMUSCULAR | Status: AC
  Filled 2021-10-30: qty 5

## 2021-10-30 MED ORDER — CHLORHEXIDINE GLUCONATE 0.12 % MT SOLN: 15.0000 mL | Freq: Once | OROMUCOSAL | Status: AC

## 2021-10-30 MED ORDER — ERYTHROMYCIN 5 MG/GM OP OINT
TOPICAL_OINTMENT | OPHTHALMIC | Status: DC | PRN
  Administered 2021-10-30: 1 via OPHTHALMIC

## 2021-10-30 MED ORDER — BSS IO SOLN
INTRAOCULAR | Status: DC | PRN
  Administered 2021-10-30: 15 mL

## 2021-10-30 MED ORDER — BUPIVACAINE HCL (PF) 0.75 % IJ SOLN
INTRAMUSCULAR | Status: AC
  Filled 2021-10-30: qty 10

## 2021-10-30 MED ORDER — PROPOFOL 10 MG/ML IV BOLUS
INTRAVENOUS | Status: DC | PRN
  Administered 2021-10-30: 40 mg via INTRAVENOUS

## 2021-10-30 MED ORDER — PROPOFOL 10 MG/ML IV BOLUS
INTRAVENOUS | Status: AC
  Filled 2021-10-30: qty 20

## 2021-10-30 MED ORDER — LIDOCAINE-EPINEPHRINE 1 %-1:100000 IJ SOLN
INTRAMUSCULAR | Status: AC
  Filled 2021-10-30: qty 1

## 2021-10-30 MED ORDER — BUPIVACAINE HCL (PF) 0.75 % IJ SOLN
INTRAMUSCULAR | Status: DC | PRN
  Administered 2021-10-30: 2.5 mL

## 2021-10-30 MED ORDER — BSS IO SOLN
INTRAOCULAR | Status: AC
  Filled 2021-10-30: qty 15

## 2021-10-30 MED ORDER — ORAL CARE MOUTH RINSE: 15.0000 mL | Freq: Once | OROMUCOSAL | Status: AC

## 2021-10-30 MED ORDER — FENTANYL CITRATE (PF) 250 MCG/5ML IJ SOLN
INTRAMUSCULAR | Status: AC
  Filled 2021-10-30: qty 5

## 2021-10-30 MED ORDER — PROPOFOL 1000 MG/100ML IV EMUL
INTRAVENOUS | Status: AC
  Filled 2021-10-30: qty 200

## 2021-10-30 MED ORDER — PHENYLEPHRINE HCL-NACL 20-0.9 MG/250ML-% IV SOLN
INTRAVENOUS | Status: AC
  Filled 2021-10-30: qty 250

## 2021-10-30 SURGICAL SUPPLY — 47 items
APPLICATOR COTTON TIP 6 STRL (MISCELLANEOUS) ×1 IMPLANT
APPLICATOR COTTON TIP 6IN STRL (MISCELLANEOUS) ×1
APPLICATOR DR MATTHEWS STRL (MISCELLANEOUS) IMPLANT
BAG COUNTER SPONGE SURGICOUNT (BAG) ×1 IMPLANT
BLADE SURG 15 STRL LF DISP TIS (BLADE) ×1 IMPLANT
BLADE SURG 15 STRL SS (BLADE) ×1
BNDG EYE OVAL (GAUZE/BANDAGES/DRESSINGS) IMPLANT
CLSR STERI-STRIP ANTIMIC 1/2X4 (GAUZE/BANDAGES/DRESSINGS) IMPLANT
CORD BIPOLAR FORCEPS 12FT (ELECTRODE) ×1 IMPLANT
COVER SURGICAL LIGHT HANDLE (MISCELLANEOUS) ×1 IMPLANT
DRAPE ORTHO SPLIT 77X108 STRL (DRAPES) ×1
DRAPE SURG ORHT 6 SPLT 77X108 (DRAPES) ×1 IMPLANT
DRSG TELFA 3X8 NADH STRL (GAUZE/BANDAGES/DRESSINGS) IMPLANT
ELECT NDL BLADE 2-5/6 (NEEDLE) IMPLANT
ELECT NEEDLE BLADE 2-5/6 (NEEDLE) IMPLANT
ELECT REM PT RETURN 9FT ADLT (ELECTROSURGICAL) ×1
ELECTRODE REM PT RTRN 9FT ADLT (ELECTROSURGICAL) ×1 IMPLANT
FORCEPS BIPOLAR SPETZLER 8 1.0 (NEUROSURGERY SUPPLIES) ×1 IMPLANT
FRAME EYE SHIELD (PROTECTIVE WEAR) IMPLANT
GLOVE SS BIOGEL STRL SZ 7.5 (GLOVE) ×1 IMPLANT
GOWN STRL REUS W/ TWL LRG LVL3 (GOWN DISPOSABLE) ×2 IMPLANT
GOWN STRL REUS W/TWL LRG LVL3 (GOWN DISPOSABLE) ×2
KIT BASIN OR (CUSTOM PROCEDURE TRAY) ×1 IMPLANT
MARKER SKIN DUAL TIP RULER LAB (MISCELLANEOUS) ×1 IMPLANT
NDL HYPO 30X.5 LL (NEEDLE) ×1 IMPLANT
NDL PRECISIONGLIDE 27X1.5 (NEEDLE) IMPLANT
NEEDLE HYPO 30X.5 LL (NEEDLE) ×1 IMPLANT
NEEDLE PRECISIONGLIDE 27X1.5 (NEEDLE) IMPLANT
NS IRRIG 1000ML POUR BTL (IV SOLUTION) ×1 IMPLANT
PACK BASIC III (CUSTOM PROCEDURE TRAY) ×1
PACK SRG BSC III STRL LF ECLPS (CUSTOM PROCEDURE TRAY) ×1 IMPLANT
PAD ARMBOARD 7.5X6 YLW CONV (MISCELLANEOUS) ×2 IMPLANT
PATTIES SURGICAL .5 X3 (DISPOSABLE) IMPLANT
PENCIL BUTTON HOLSTER BLD 10FT (ELECTRODE) IMPLANT
PROTECTOR CORNEAL (OPHTHALMIC RELATED) IMPLANT
SET INTBT LACRIMAL .016X.025 (DRAIN) IMPLANT
SUT MERSILENE 5 0 RD 1 DA (SUTURE) IMPLANT
SUT PLAIN 5 0 P 3 18 (SUTURE) IMPLANT
SUT PLAIN GUT FAST 5-0 (SUTURE) ×1 IMPLANT
SUT PROLENE 5 0 C1 (SUTURE) IMPLANT
SUT SILK 4 0 P 3 (SUTURE) IMPLANT
SUT VIC AB 4-0 P2 18 (SUTURE) ×1 IMPLANT
SUT VICRYL 6-0 S14 (SUTURE) IMPLANT
SYR CONTROL 10ML LL (SYRINGE) ×1 IMPLANT
TOWEL GREEN STERILE FF (TOWEL DISPOSABLE) ×1 IMPLANT
WATER STERILE IRR 1000ML POUR (IV SOLUTION) ×1 IMPLANT
WIPE INSTRUMENT VISIWIPE 73X73 (MISCELLANEOUS) IMPLANT

## 2021-10-30 NOTE — Op Note (Signed)
Operative Note PATIENT NAME:  [place]: [facility] MRN:  [id number] CSN: [id number] DATE OF SERVICE:  10/30/2021 DATE OF BIRTH:  [birth year]  PREOPERATIVE DIAGNOSIS:   1. Right upper eyelid entropion, unspecified 2. Right lower eyelid ectropion, senile  POSTOPERATIVE DIAGNOSIS:  same  PROCEDURE(S) PERFORMED:  1. Right upper eyelid entropion repair by lateral tarsal strip [id number] 2. Right lower eyelid ectropion repair by lateral tarsal strip [phone]  SURGEON:  Delia Chimes, MD, PhD ASSISTANT:  none  ANESTHESIA:  MAC  FLUIDS GIVEN: Per Anesthesia   ESTIMATED BLOOD LOSS:  < 5 cc  TUBES/DRAINS:  None  COMPLICATIONS:  None  INDICATIONS:  The patient has upper lid entropion and lower eyelid ectropion of the eyelids causing visual impairment, irritation, mucous production and ocular exposure that interferes with tasks of daily living including reading and computer use.  Informed consent had been provided prior to the day of surgery at the patient's clinic visit.  All questions were answered. The patient understands that the goal of surgery is to improve visual function. Prior to entering the operative suite, the general surgical consent was signed.  The patient/advocate understands the risks of surgery include but are not limited to bleeding, infection, scar formation, asymmetry, the need for additional surgical intervention and other less common outcomes noted on the specific eyelid informed consent and anesthesia risks listed on the anesthesia consent. The patient accepts the risks and desires to proceed with surgery        DESCRIPTION OF PROCEDURE: After obtaining informed consent, the patient was taken back to the operating room and laid supine on the operating room table.  Under monitored anesthesia care, the operative site(s) were anesthetized with 2% lidocaine with epinephrine and bupivicaine.    Attention was then directed to the right lower eyelid. A  15 blade was used to incise the lateral canthus. A Wescott scissors were used to detach the inferior. lateral canthal tendon to allow for full mobility, then the lateral tarsal strip was created.  The right upper eyelid lateral canthal tendon was then released with Wescott scissors and a lateral tarsal strip prepared of the upper eyelid as well.  The anterior and posterior lamella were separated using Wescott scissors.  The mucocutaneous junction was excised.  Bipolar cautery was used for hemostasis.  A double armed 5-0 prolene suture was taken through the inferior and super crura of the lateral tarsal strips and secured to the periosteum of the lateral orbital rim. The lateral canthal angle was closed with 5-0 vicryl. Excess skin and lashes were excised with Westcott scissors.  The orbicularis was plicated to the lateral orbital rim and closed using 5-0 Vicryl suture. The gray line of the upper and lower eyelids and skin were then closed with 5-0 plain absorbing gut suture. Ointment was placed in the operative eye(s) and incision.  A pressure patch was placed over the eye to be left in place overnight.  The patient was then taken to the recovery room in stable condition.   Delia Chimes, MD, PhD Ophthalmic Plastic and Reconstructive Surgery [facility] [phone] (office)

## 2021-10-30 NOTE — Transfer of Care (Signed)
Immediate Anesthesia Transfer of Care Note  Patient: [name]  Procedure(s) Performed: REPAIR OF ECTROPION (Right: Eye) REPAIR OF ENTROPION (Right: Eye)  Patient Location: PACU  Anesthesia Type:MAC  Level of Consciousness: awake, alert , and oriented  Airway & Oxygen Therapy: Patient Spontanous Breathing  Post-op Assessment: Report given to RN, Post -op Vital signs reviewed and stable, Patient moving all extremities X 4, and Patient able to stick tongue midline  Post vital signs: Reviewed  Last Vitals:  Vitals Value Taken Time  BP 156/80 10/30/21 1301  Temp 97.7   Pulse 60 10/30/21 1302  Resp 18 10/30/21 1302  SpO2 95 % 10/30/21 1302  Vitals shown include unvalidated device data.  Last Pain:  Vitals:   10/30/21 1022  TempSrc:   PainSc: 4          Complications: No notable events documented.

## 2021-10-30 NOTE — Interval H&P Note (Signed)
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  The various methods of treatment have been discussed with the patient and family. After consideration of risks, benefits and other options for treatment, the patient has consented to  Procedure(s): REPAIR OF ECTROPION, REPAIR OF ENTROPIAN (Right) as a surgical intervention.  The patient's history has been reviewed, patient examined, no change in status, stable for surgery.  I have reviewed the patient's chart and labs.  Questions were answered to the patient's satisfaction.     Delia Chimes

## 2021-10-30 NOTE — Anesthesia Postprocedure Evaluation (Signed)
Anesthesia Post Note  Patient: [name]  Procedure(s) Performed: REPAIR OF ECTROPION (Right: Eye) REPAIR OF ENTROPION (Right: Eye)     Patient location during evaluation: PACU Anesthesia Type: MAC Level of consciousness: awake Pain management: pain level controlled Vital Signs Assessment: post-procedure vital signs reviewed and stable Respiratory status: spontaneous breathing, nonlabored ventilation, respiratory function stable and patient connected to nasal cannula oxygen Cardiovascular status: stable and blood pressure returned to baseline Postop Assessment: no apparent nausea or vomiting Anesthetic complications: no   No notable events documented.  Last Vitals:  Vitals:   10/30/21 1315 10/30/21 1330  BP: (!) 167/83 (!) 176/85  Pulse: 63 60  Resp: 12 16  Temp:    SpO2: 96% 95%    Last Pain:  Vitals:   10/30/21 1330  TempSrc:   PainSc: 0-No pain                 Aleysia Oltmann P Doretta Remmert

## 2021-10-31 ENCOUNTER — Encounter (HOSPITAL_COMMUNITY): Payer: Self-pay | Admitting: Optometry

## 2021-11-12 DIAGNOSIS — K225 Diverticulum of esophagus, acquired: Secondary | ICD-10-CM | POA: Insufficient documentation

## 2021-11-12 DIAGNOSIS — K224 Dyskinesia of esophagus: Secondary | ICD-10-CM | POA: Diagnosis present

## 2021-11-12 DIAGNOSIS — G8929 Other chronic pain: Secondary | ICD-10-CM | POA: Insufficient documentation

## 2021-11-25 ENCOUNTER — Ambulatory Visit (INDEPENDENT_AMBULATORY_CARE_PROVIDER_SITE_OTHER): Payer: Medicare Other

## 2021-11-25 DIAGNOSIS — I495 Sick sinus syndrome: Secondary | ICD-10-CM

## 2021-11-25 LAB — CUP PACEART REMOTE DEVICE CHECK
Battery Impedance: 3341 Ohm
Battery Remaining Longevity: 19 mo
Battery Voltage: 2.73 V
Brady Statistic AP VP Percent: 0 %
Brady Statistic AP VS Percent: 88 %
Brady Statistic AS VP Percent: 0 %
Brady Statistic AS VS Percent: 12 %
Date Time Interrogation Session: 20231122131208
Implantable Lead Connection Status: 753985
Implantable Lead Connection Status: 753985
Implantable Lead Implant Date: 20100910
Implantable Lead Implant Date: 20100910
Implantable Lead Location: 753859
Implantable Lead Location: 753860
Implantable Lead Model: 5076
Implantable Lead Model: 5076
Implantable Pulse Generator Implant Date: 20100910
Lead Channel Impedance Value: 491 Ohm
Lead Channel Impedance Value: 702 Ohm
Lead Channel Pacing Threshold Amplitude: 1 V
Lead Channel Pacing Threshold Amplitude: 1.625 V
Lead Channel Pacing Threshold Pulse Width: 0.4 ms
Lead Channel Pacing Threshold Pulse Width: 0.4 ms
Lead Channel Setting Pacing Amplitude: 2 V
Lead Channel Setting Pacing Amplitude: 3.25 V
Lead Channel Setting Pacing Pulse Width: 0.4 ms
Lead Channel Setting Sensing Sensitivity: 5.6 mV
Zone Setting Status: 755011
Zone Setting Status: 755011

## 2021-12-11 ENCOUNTER — Ambulatory Visit: Payer: Medicare Other | Attending: Cardiovascular Disease | Admitting: Cardiovascular Disease

## 2021-12-11 ENCOUNTER — Encounter: Payer: Self-pay | Admitting: Cardiovascular Disease

## 2021-12-11 VITALS — BP 120/78 | HR 83 | Ht 67.0 in | Wt 138.4 lb

## 2021-12-11 DIAGNOSIS — Z950 Presence of cardiac pacemaker: Secondary | ICD-10-CM | POA: Diagnosis not present

## 2021-12-11 DIAGNOSIS — E785 Hyperlipidemia, unspecified: Secondary | ICD-10-CM | POA: Diagnosis not present

## 2021-12-11 DIAGNOSIS — I25118 Atherosclerotic heart disease of native coronary artery with other forms of angina pectoris: Secondary | ICD-10-CM

## 2021-12-11 DIAGNOSIS — I739 Peripheral vascular disease, unspecified: Secondary | ICD-10-CM | POA: Diagnosis not present

## 2021-12-11 MED ORDER — ROSUVASTATIN CALCIUM 5 MG PO TABS: 5.0000 mg | ORAL_TABLET | Freq: Every day | ORAL | 3 refills | Status: DC

## 2021-12-11 NOTE — Patient Instructions (Signed)
Medication Instructions:  START Rosuvastatin 5 mg once daily  *If you need a refill on your cardiac medications before your next appointment, please call your pharmacy*   Lab Work: None ordered If you have labs (blood work) drawn today and your tests are completely normal, you will receive your results only by: MyChart Message (if you have MyChart) OR A paper copy in the mail If you have any lab test that is abnormal or we need to change your treatment, we will call you to review the results.   Testing/Procedures: Your physician has requested that you have a lower extremity arterial segmental duplex. During this test, ultrasound is used to evaluate arterial blood flow in the legs. Allow one hour for this exam. There are no restrictions or special instructions. This will take place at [address] [address], [place] [address]   Follow-Up: At [facility], you and your health needs are our priority.  As part of our continuing mission to provide you with exceptional heart care, we have created designated Provider Care Teams.  These Care Teams include your primary Cardiologist (physician) and Advanced Practice Providers (APPs -  Physician Assistants and Nurse Practitioners) who all work together to provide you with the care you need, when you need it.  We recommend signing up for the patient portal called "MyChart".  Sign up information is provided on this After Visit Summary.  MyChart is used to connect with patients for Virtual Visits (Telemedicine).  Patients are able to view lab/test results, encounter notes, upcoming appointments, etc.  Non-urgent messages can be sent to your provider as well.   To learn more about what you can do with MyChart, go to [email or web address].    Your next appointment:   6 month(s)  The format for your next appointment:   In Person  Provider:   You may see Lorine Bears, MD or one of the following Advanced Practice  Providers on your designated Care Team:   Nicolasa Ducking, NP Eula Listen, PA-C Cadence Fransico Michael, PA-C Charlsie Quest, NP    Important Information About Sugar

## 2021-12-11 NOTE — Progress Notes (Signed)
Cardiology Office Note   Date:  12/11/2021   ID:  [name], DOB Oct 01, 1927, MRN [id number]  PCP:  Barron Alvine, MD  Cardiologist:   Lorine Bears, MD   Chief Complaint  Patient presents with   Other    6 month f/u no complaints today . Meds reviewed verbally with pt.       History of Present Illness: [name] is a 88 y.o. male who presents for  a follow-up visit  regarding coronary artery disease.  He has known history of coronary artery disease status post CABG in 03-May-1999. Most recent cardiac catheterization in May 02, 2008 showed patent grafts with 80% ostial left circumflex stenosis. Subsequent nuclear stress test in May 02, 2009 showed no evidence of ischemia with normal ejection fraction. He is also status post dual-chamber pacemaker placement monitored by Dr. Graciela Husbands. He has chronic headache.  He had myalgia with atorvastatin and did not want to go on any other medications.   His wife died in 05/03/22 of this year.    He had right eye surgery in October.  He has been doing reasonably well and denies chest pain or shortness of breath.  He reports bilateral leg discomfort with walking that seems to be worse on the right side.  Past Medical History:  Diagnosis Date   Arthritis    CAD (coronary artery disease)    s/p CABG in May 03, 1999; lexiscan 2009-05-02  no ischemia and nl  LV function   Depression    Dysrhythmia    GERD (gastroesophageal reflux disease)    Headache    headaches for 10 years-been to alot of physicians -cannot find problem   History of kidney stones    HLD (hyperlipidemia)    Hypertension    Myocardial infarction (HCC) 02/1999   Pacemaker    sees Dr. Graciela Husbands   Pneumonia 11/30/2017   Sinus node dysfunction [facility])    s/p PPM   Sleep apnea 04/20/2011   dx at [facility] not tolerate CPAP-does not use   Ventricular tachycardia, non-sustained [facility])     Past Surgical History:  Procedure Laterality Date   BLADDER STONE REMOVAL  03-May-2003   CARDIAC CATHETERIZATION   09/2008   patent grafts. 80% proximal stneosis in left circumflex artery (subsequent stress test showed no ischemia)   cataract surgery     bilateral   CORONARY ARTERY BYPASS GRAFT  03/1999   LIMA to LAD, SVG to RCA, SVG to ramus, sequential SVG to  first and second diagonal.   ECTROPION REPAIR Right 10/30/2021   Procedure: REPAIR OF ECTROPION;  Surgeon: Dairl Ponder, MD;  Location[facility] OR;  Service: Ophthalmology;  Laterality: Right;   ENTROPIAN REPAIR Right 10/30/2021   Procedure: REPAIR OF ENTROPION;  Surgeon: Dairl Ponder, MD;  Location: [facility] OR;  Service: Ophthalmology;  Laterality: Right;   INSERT / REPLACE / REMOVE PACEMAKER     PACEMAKER INSERTION  09/13/2008   insertion pacemaker     Current Outpatient Medications  Medication Sig Dispense Refill   aspirin EC 81 MG tablet Take 81 mg by mouth daily. Swallow whole.     erythromycin ophthalmic ointment 1 Application.     gabapentin (NEURONTIN) 600 MG tablet Take 600 mg by mouth 2 (two) times daily.      metoprolol tartrate (LOPRESSOR) 25 MG tablet Take 1 tablet (25 mg total) by mouth 2 (two) times daily. 90 tablet 0   Multiple Vitamin (MULTIVITAMIN) tablet Take 1 tablet by mouth daily.  nitroGLYCERIN (NITROSTAT) 0.4 MG SL tablet Place 0.4 mg under the tongue every 5 (five) minutes as needed for chest pain.      omeprazole (PRILOSEC) 20 MG capsule Take 20 mg by mouth 2 (two) times daily before a meal.     ranolazine (RANEXA) 500 MG 12 hr tablet Take 1 tablet by mouth twice daily (Patient taking differently: Take 500 mg by mouth 2 (two) times daily.) 60 tablet 3   sertraline (ZOLOFT) 50 MG tablet TAKE 2 TABLETS BY MOUTH  DAILY (Patient taking differently: Take 50 mg by mouth daily.) 60 tablet 0   tamsulosin (FLOMAX) 0.4 MG CAPS capsule Take 0.4 mg by mouth daily.      traZODone (DESYREL) 100 MG tablet Take 200 mg by mouth at bedtime.  0   vitamin B-12 (CYANOCOBALAMIN) 500 MCG tablet Take 500 mcg by mouth daily.      isosorbide mononitrate (IMDUR) 30 MG 24 hr tablet Take 1 tablet (30 mg total) by mouth daily. (Patient not taking: Reported on 12/11/2021) 90 tablet 3   No current facility-administered medications for this visit.    Allergies:   Patient has no known allergies.    Social History:  The patient  reports that he has never smoked. He has never used smokeless tobacco. He reports that he does not drink alcohol and does not use drugs.   Family History:  The patient's family history includes Cancer in his mother; Heart attack in his brother, father, and another family member; Heart disease in his brother, father, and sister.    ROS:  Please see the history of present illness.   Otherwise, review of systems are positive for none.   All other systems are reviewed and negative.    PHYSICAL EXAM: VS:  BP 120/78 (BP Location: Left Arm, Patient Position: Sitting, Cuff Size: Normal)   Pulse 83   Ht 5\' 7"  (1.702 m)   Wt 138 lb 6 oz (62.8 kg)   SpO2 94%   BMI 21.67 kg/m  , BMI Body mass index is 21.67 kg/m. GEN: Well nourished, well developed, in no acute distress  HEENT: normal  Neck: no JVD, carotid bruits, or masses Cardiac: RRR; no murmurs, rubs, or gallops,no edema  Respiratory:  clear to auscultation bilaterally, normal work of breathing GI: soft, nontender, nondistended, + BS MS: no deformity or atrophy  Skin: warm and dry, no rash Neuro:  Strength and sensation are intact Psych: euthymic mood, full affect Vascular: Femoral pulses: Mildly diminished bilaterally.  Distal pulses are not palpable.  EKG:  EKG is ordered today. The ekg ordered today demonstrates atrial paced rhythm with nonspecific T wave changes.   Recent Labs: 10/30/2021: BUN 17; Creatinine, Ser 1.36; Hemoglobin 10.6; Platelets 169; Potassium 4.7; Sodium 143    Lipid Panel    Component Value Date/Time   CHOL  09/12/2008 0145    133        ATP III CLASSIFICATION:  <200     mg/dL   Desirable  11/12/2008  mg/dL    Borderline High  185-631    mg/dL   High          TRIG 75 09/12/2008 0145   HDL 43 09/12/2008 0145   CHOLHDL 3.1 09/12/2008 0145   VLDL 15 09/12/2008 0145   LDLCALC  09/12/2008 0145    75        Total Cholesterol/HDL:CHD Risk Coronary Heart Disease Risk Table  Men   Women  1/2 Average Risk   3.4   3.3  Average Risk       5.0   4.4  2 X Average Risk   9.6   7.1  3 X Average Risk  23.4   11.0        Use the calculated Patient Ratio above and the CHD Risk Table to determine the patient's CHD Risk.        ATP III CLASSIFICATION (LDL):  <100     mg/dL   Optimal  762-831  mg/dL   Near or Above                    Optimal  130-159  mg/dL   Borderline  517-616  mg/dL   High  >073     mg/dL   Very High      Wt Readings from Last 3 Encounters:  12/11/21 138 lb 6 oz (62.8 kg)  10/30/21 136 lb (61.7 kg)  06/03/21 139 lb (63 kg)        ASSESSMENT AND PLAN:  1. Coronary artery disease involving native coronary arteries with stable angina: He has stable angina controlled with Ranexa and metoprolol.  He is not sure if he is taking Imdur or not.  If he has not been taking this medication and has no symptoms, no need to resume.  2. Hyperlipidemia: The patient is intolerant to statins and most recently stopped atorvastatin.  Will try small dose rosuvastatin 5 mg once daily.  3. Status post dual-chamber pacemaker placement: Followed by Dr. Graciela Husbands.  4.  Atypical leg claudication with abnormal vascular exam and diminished pulses.  I requested lower extremity arterial Doppler.   Disposition:   FU with me in 6 months  Signed,  Lorine Bears, MD  12/11/2021 10:25 AM    [facility]

## 2021-12-16 NOTE — Progress Notes (Signed)
Remote pacemaker transmission.   

## 2022-01-12 ENCOUNTER — Other Ambulatory Visit: Payer: Self-pay | Admitting: Cardiovascular Disease

## 2022-01-12 DIAGNOSIS — R001 Bradycardia, unspecified: Secondary | ICD-10-CM

## 2022-01-12 DIAGNOSIS — Z950 Presence of cardiac pacemaker: Secondary | ICD-10-CM

## 2022-01-20 ENCOUNTER — Encounter (HOSPITAL_COMMUNITY): Payer: Self-pay

## 2022-01-22 ENCOUNTER — Encounter
Admission: AD | Disposition: A | Discharge status: Hospice | Payer: Self-pay | Source: Other Acute Inpatient Hospital | Attending: Internal Medicine

## 2022-01-22 ENCOUNTER — Other Ambulatory Visit: Payer: Self-pay

## 2022-01-22 ENCOUNTER — Inpatient Hospital Stay (HOSPITAL_COMMUNITY)
Admission: AD | Admit: 2022-01-22 | Discharge: 2022-01-22 | Disposition: A | Discharge status: Home / Self Care | Payer: Medicare Other | Source: Other Acute Inpatient Hospital | Attending: Internal Medicine | Admitting: Internal Medicine

## 2022-01-22 ENCOUNTER — Encounter: Payer: Self-pay | Admitting: Internal Medicine

## 2022-01-22 ENCOUNTER — Inpatient Hospital Stay: Payer: Medicare Other | Admitting: Anesthesiology

## 2022-01-22 ENCOUNTER — Inpatient Hospital Stay
Admission: AD | Admit: 2022-01-22 | Discharge: 2022-01-23 | DRG: 811 | Disposition: A | Discharge status: Hospice | Payer: Medicare Other | Source: Other Acute Inpatient Hospital | Attending: Internal Medicine | Admitting: Internal Medicine

## 2022-01-22 DIAGNOSIS — Z7189 Other specified counseling: Secondary | ICD-10-CM

## 2022-01-22 DIAGNOSIS — R400 Somnolence: Secondary | ICD-10-CM | POA: Diagnosis not present

## 2022-01-22 DIAGNOSIS — K921 Melena: Secondary | ICD-10-CM | POA: Diagnosis not present

## 2022-01-22 DIAGNOSIS — I129 Hypertensive chronic kidney disease with stage 1 through stage 4 chronic kidney disease, or unspecified chronic kidney disease: Secondary | ICD-10-CM | POA: Diagnosis present

## 2022-01-22 DIAGNOSIS — Z950 Presence of cardiac pacemaker: Secondary | ICD-10-CM | POA: Diagnosis not present

## 2022-01-22 DIAGNOSIS — K224 Dyskinesia of esophagus: Secondary | ICD-10-CM | POA: Diagnosis present

## 2022-01-22 DIAGNOSIS — N1831 Chronic kidney disease, stage 3a: Secondary | ICD-10-CM | POA: Diagnosis present

## 2022-01-22 DIAGNOSIS — Z87442 Personal history of urinary calculi: Secondary | ICD-10-CM

## 2022-01-22 DIAGNOSIS — D62 Acute posthemorrhagic anemia: Principal | ICD-10-CM | POA: Diagnosis present

## 2022-01-22 DIAGNOSIS — Z515 Encounter for palliative care: Secondary | ICD-10-CM | POA: Diagnosis not present

## 2022-01-22 DIAGNOSIS — Z8249 Family history of ischemic heart disease and other diseases of the circulatory system: Secondary | ICD-10-CM

## 2022-01-22 DIAGNOSIS — G4730 Sleep apnea, unspecified: Secondary | ICD-10-CM | POA: Diagnosis present

## 2022-01-22 DIAGNOSIS — R002 Palpitations: Secondary | ICD-10-CM | POA: Diagnosis present

## 2022-01-22 DIAGNOSIS — E785 Hyperlipidemia, unspecified: Secondary | ICD-10-CM | POA: Diagnosis present

## 2022-01-22 DIAGNOSIS — K219 Gastro-esophageal reflux disease without esophagitis: Secondary | ICD-10-CM | POA: Diagnosis present

## 2022-01-22 DIAGNOSIS — I4891 Unspecified atrial fibrillation: Secondary | ICD-10-CM | POA: Diagnosis not present

## 2022-01-22 DIAGNOSIS — F32A Depression, unspecified: Secondary | ICD-10-CM | POA: Diagnosis present

## 2022-01-22 DIAGNOSIS — I214 Non-ST elevation (NSTEMI) myocardial infarction: Secondary | ICD-10-CM | POA: Diagnosis present

## 2022-01-22 DIAGNOSIS — Z951 Presence of aortocoronary bypass graft: Secondary | ICD-10-CM

## 2022-01-22 DIAGNOSIS — I480 Paroxysmal atrial fibrillation: Secondary | ICD-10-CM | POA: Diagnosis present

## 2022-01-22 DIAGNOSIS — Z7982 Long term (current) use of aspirin: Secondary | ICD-10-CM

## 2022-01-22 DIAGNOSIS — I495 Sick sinus syndrome: Secondary | ICD-10-CM | POA: Diagnosis present

## 2022-01-22 DIAGNOSIS — I2510 Atherosclerotic heart disease of native coronary artery without angina pectoris: Secondary | ICD-10-CM | POA: Diagnosis present

## 2022-01-22 DIAGNOSIS — I252 Old myocardial infarction: Secondary | ICD-10-CM | POA: Diagnosis not present

## 2022-01-22 DIAGNOSIS — K449 Diaphragmatic hernia without obstruction or gangrene: Secondary | ICD-10-CM | POA: Diagnosis present

## 2022-01-22 DIAGNOSIS — R110 Nausea: Secondary | ICD-10-CM | POA: Diagnosis not present

## 2022-01-22 DIAGNOSIS — E876 Hypokalemia: Secondary | ICD-10-CM | POA: Diagnosis present

## 2022-01-22 DIAGNOSIS — L8990 Pressure ulcer of unspecified site, unspecified stage: Secondary | ICD-10-CM | POA: Insufficient documentation

## 2022-01-22 DIAGNOSIS — Z66 Do not resuscitate: Secondary | ICD-10-CM | POA: Diagnosis present

## 2022-01-22 DIAGNOSIS — Z79899 Other long term (current) drug therapy: Secondary | ICD-10-CM

## 2022-01-22 HISTORY — PX: ESOPHAGOGASTRODUODENOSCOPY (EGD) WITH PROPOFOL: SHX5813

## 2022-01-22 LAB — COMPREHENSIVE METABOLIC PANEL
ALT: 63 U/L — ABNORMAL HIGH (ref 0–44)
AST: 50 U/L — ABNORMAL HIGH (ref 15–41)
Albumin: 2.6 g/dL — ABNORMAL LOW (ref 3.5–5.0)
Alkaline Phosphatase: 60 U/L (ref 38–126)
Anion gap: 7 (ref 5–15)
BUN: 24 mg/dL — ABNORMAL HIGH (ref 8–23)
CO2: 26 mmol/L (ref 22–32)
Calcium: 7.9 mg/dL — ABNORMAL LOW (ref 8.9–10.3)
Chloride: 109 mmol/L (ref 98–111)
Creatinine, Ser: 1.06 mg/dL (ref 0.61–1.24)
GFR, Estimated: >60 mL/min (ref >60)
Glucose, Bld: 121 mg/dL — ABNORMAL HIGH (ref 70–99)
Potassium: 3.3 mmol/L — ABNORMAL LOW (ref 3.5–5.1)
Sodium: 142 mmol/L (ref 135–145)
Total Bilirubin: 0.9 mg/dL (ref 0.3–1.2)
Total Protein: 5.1 g/dL — ABNORMAL LOW (ref 6.5–8.1)

## 2022-01-22 LAB — CBC WITH DIFFERENTIAL/PLATELET
Abs Immature Granulocytes: 0.05 10*3/uL (ref 0.00–0.07)
Basophils Absolute: 0 10*3/uL (ref 0.0–0.1)
Basophils Relative: 0 %
Eosinophils Absolute: 0 10*3/uL (ref 0.0–0.5)
Eosinophils Relative: 0 %
HCT: 25.4 % — ABNORMAL LOW (ref 39.0–52.0)
Hemoglobin: 8.1 g/dL — ABNORMAL LOW (ref 13.0–17.0)
Immature Granulocytes: 1 %
Lymphocytes Relative: 16 %
Lymphs Abs: 1.6 10*3/uL (ref 0.7–4.0)
MCH: 28.7 pg (ref 26.0–34.0)
MCHC: 31.9 g/dL (ref 30.0–36.0)
MCV: 90.1 fL (ref 80.0–100.0)
Monocytes Absolute: 1.1 10*3/uL — ABNORMAL HIGH (ref 0.1–1.0)
Monocytes Relative: 11 %
Neutro Abs: 7.6 10*3/uL (ref 1.7–7.7)
Neutrophils Relative %: 72 %
Platelets: 204 10*3/uL (ref 150–400)
RBC: 2.82 MIL/uL — ABNORMAL LOW (ref 4.22–5.81)
RDW: 19.3 % — ABNORMAL HIGH (ref 11.5–15.5)
WBC: 10.4 10*3/uL (ref 4.0–10.5)
nRBC: 0 % (ref 0.0–0.2)

## 2022-01-22 LAB — TROPONIN I (HIGH SENSITIVITY)
Troponin I (High Sensitivity): 4892 ng/L (ref <18)
Troponin I (High Sensitivity): 5022 ng/L (ref <18)

## 2022-01-22 LAB — TYPE AND SCREEN
ABO/RH(D): B POS
Antibody Screen: NEGATIVE

## 2022-01-22 LAB — HEMOGLOBIN: Hemoglobin: 8.4 g/dL — ABNORMAL LOW (ref 13.0–17.0)

## 2022-01-22 LAB — ECHOCARDIOGRAM COMPLETE
AR max vel: 2.78 cm2
AV Area VTI: 3.21 cm2
AV Area mean vel: 2.73 cm2
AV Mean grad: 3 mmHg
AV Peak grad: 5.2 mmHg
Ao pk vel: 1.14 m/s
Area-P 1/2: 8.34 cm2
Height: 67 in
S' Lateral: 2.8 cm
Weight: 2144.63 oz

## 2022-01-22 LAB — PHOSPHORUS: Phosphorus: 4.3 mg/dL (ref 2.5–4.6)

## 2022-01-22 LAB — MAGNESIUM: Magnesium: 1.8 mg/dL (ref 1.7–2.4)

## 2022-01-22 SURGERY — ESOPHAGOGASTRODUODENOSCOPY (EGD) WITH PROPOFOL
Anesthesia: General

## 2022-01-22 MED ORDER — SODIUM CHLORIDE 0.9 % IV SOLN: INTRAVENOUS | Status: DC

## 2022-01-22 MED ORDER — SERTRALINE HCL 50 MG PO TABS
50.0000 mg | ORAL_TABLET | Freq: Every day | ORAL | Status: DC
  Administered 2022-01-23: 50 mg via ORAL
  Filled 2022-01-22: qty 1

## 2022-01-22 MED ORDER — PANTOPRAZOLE SODIUM 40 MG IV SOLR
40.0000 mg | INTRAVENOUS | Status: DC
  Administered 2022-01-22 – 2022-01-23 (×2): 40 mg via INTRAVENOUS
  Filled 2022-01-22 (×2): qty 10

## 2022-01-22 MED ORDER — HYDROCODONE-ACETAMINOPHEN 5-325 MG PO TABS
1.0000 | ORAL_TABLET | ORAL | Status: DC | PRN
  Administered 2022-01-22 – 2022-01-23 (×2): 1 via ORAL
  Filled 2022-01-22 (×2): qty 1

## 2022-01-22 MED ORDER — METOPROLOL TARTRATE 25 MG PO TABS
25.0000 mg | ORAL_TABLET | Freq: Four times a day (QID) | ORAL | Status: DC
  Administered 2022-01-22 – 2022-01-23 (×3): 25 mg via ORAL
  Filled 2022-01-22 (×4): qty 1

## 2022-01-22 MED ORDER — ASPIRIN 81 MG PO TBEC
81.0000 mg | DELAYED_RELEASE_TABLET | Freq: Every day | ORAL | Status: DC
  Administered 2022-01-23: 81 mg via ORAL
  Filled 2022-01-22: qty 1

## 2022-01-22 MED ORDER — ACETAMINOPHEN 325 MG PO TABS: 650.0000 mg | ORAL_TABLET | ORAL | Status: DC | PRN

## 2022-01-22 MED ORDER — ETOMIDATE 2 MG/ML IV SOLN
INTRAVENOUS | Status: DC | PRN
  Administered 2022-01-22: 4 mg via INTRAVENOUS

## 2022-01-22 MED ORDER — ACETAMINOPHEN 650 MG RE SUPP: 650.0000 mg | Freq: Four times a day (QID) | RECTAL | Status: DC | PRN

## 2022-01-22 MED ORDER — DILTIAZEM HCL-DEXTROSE 125-5 MG/125ML-% IV SOLN (PREMIX)
5.0000 mg/h | INTRAVENOUS | Status: DC
  Filled 2022-01-22: qty 125

## 2022-01-22 MED ORDER — POTASSIUM CHLORIDE CRYS ER 20 MEQ PO TBCR
30.0000 meq | EXTENDED_RELEASE_TABLET | Freq: Two times a day (BID) | ORAL | Status: AC
  Administered 2022-01-22 (×2): 30 meq via ORAL
  Filled 2022-01-22 (×2): qty 1

## 2022-01-22 MED ORDER — SODIUM CHLORIDE 0.9 % IV BOLUS
500.0000 mL | Freq: Once | INTRAVENOUS | Status: AC
  Administered 2022-01-22: 500 mL via INTRAVENOUS

## 2022-01-22 MED ORDER — METOPROLOL TARTRATE 5 MG/5ML IV SOLN
5.0000 mg | Freq: Once | INTRAVENOUS | Status: AC
  Administered 2022-01-22: 5 mg via INTRAVENOUS
  Filled 2022-01-22: qty 5

## 2022-01-22 MED ORDER — TRAZODONE HCL 100 MG PO TABS
200.0000 mg | ORAL_TABLET | Freq: Every day | ORAL | Status: DC
  Administered 2022-01-22: 200 mg via ORAL
  Filled 2022-01-22: qty 2

## 2022-01-22 MED ORDER — ACETAMINOPHEN 325 MG PO TABS
650.0000 mg | ORAL_TABLET | Freq: Four times a day (QID) | ORAL | Status: DC | PRN
  Administered 2022-01-23: 650 mg via ORAL
  Filled 2022-01-22: qty 2

## 2022-01-22 MED ORDER — METOPROLOL TARTRATE 25 MG PO TABS: 25.0000 mg | ORAL_TABLET | Freq: Four times a day (QID) | ORAL | Status: DC

## 2022-01-22 MED ORDER — ONDANSETRON HCL 4 MG/2ML IJ SOLN
4.0000 mg | Freq: Four times a day (QID) | INTRAMUSCULAR | Status: DC | PRN
  Administered 2022-01-22 – 2022-01-23 (×5): 4 mg via INTRAVENOUS
  Filled 2022-01-22 (×5): qty 2

## 2022-01-22 MED ORDER — TAMSULOSIN HCL 0.4 MG PO CAPS
0.4000 mg | ORAL_CAPSULE | Freq: Every day | ORAL | Status: DC
  Administered 2022-01-23: 0.4 mg via ORAL
  Filled 2022-01-22: qty 1

## 2022-01-22 MED ORDER — ROSUVASTATIN CALCIUM 5 MG PO TABS
5.0000 mg | ORAL_TABLET | Freq: Every day | ORAL | Status: DC
  Administered 2022-01-23: 5 mg via ORAL
  Filled 2022-01-22: qty 1

## 2022-01-22 MED ORDER — ONDANSETRON HCL 4 MG/2ML IJ SOLN: 4.0000 mg | Freq: Four times a day (QID) | INTRAMUSCULAR | Status: DC | PRN

## 2022-01-22 MED ORDER — ALUM & MAG HYDROXIDE-SIMETH 200-200-20 MG/5ML PO SUSP
30.0000 mL | Freq: Four times a day (QID) | ORAL | Status: DC | PRN
  Administered 2022-01-22: 30 mL via ORAL
  Filled 2022-01-22: qty 30

## 2022-01-22 MED ORDER — METOPROLOL TARTRATE 25 MG PO TABS
25.0000 mg | ORAL_TABLET | Freq: Two times a day (BID) | ORAL | Status: DC
  Administered 2022-01-22: 25 mg via ORAL
  Filled 2022-01-22: qty 1

## 2022-01-22 MED ORDER — RANOLAZINE ER 500 MG PO TB12
500.0000 mg | ORAL_TABLET | Freq: Two times a day (BID) | ORAL | Status: DC
  Administered 2022-01-22 – 2022-01-23 (×2): 500 mg via ORAL
  Filled 2022-01-22 (×2): qty 1

## 2022-01-22 MED ORDER — NITROGLYCERIN 0.4 MG SL SUBL: 0.4000 mg | SUBLINGUAL_TABLET | SUBLINGUAL | Status: DC | PRN

## 2022-01-22 MED ORDER — ONDANSETRON HCL 4 MG PO TABS: 4.0000 mg | ORAL_TABLET | Freq: Four times a day (QID) | ORAL | Status: DC | PRN

## 2022-01-22 MED ORDER — MORPHINE SULFATE (PF) 2 MG/ML IV SOLN
2.0000 mg | INTRAVENOUS | Status: DC | PRN
  Administered 2022-01-23 (×3): 2 mg via INTRAVENOUS
  Filled 2022-01-22 (×3): qty 1

## 2022-01-22 NOTE — Progress Notes (Signed)
*  PRELIMINARY RESULTS* Echocardiogram 2D Echocardiogram has been performed.  [name] 01/22/2022, 8:35 AM

## 2022-01-22 NOTE — H&P (Signed)
History and Physical    Patient: [name] UXL:[id number] DOB: 28-Jan-1927 DOA: 01/22/2022 DOS: the patient was seen and examined on 01/22/2022 PCP: Barron Alvine, MD  Patient coming from: Home  Chief Complaint: No chief complaint on file.  HPI: [name] is a 89 y.o. male with medical history significant of As a transfer from ED at [facility] for A-fib with RVR, melena with anemia and elevated troponin.  Patient presented to the ED after his PCP found him to be in rapid A-fib.  He denies chest pain or shortness of breath.  At the ED in [place] a CT GIB study was done which was negative for acute bleed.  He was found to have uptrending troponins 6.9-7.1-7.6.  Hemoglobin was 8.  Patient received IV and p.o. metoprolol while in the ED for rate control and transfuse a unit PRBC.  Upon arrival to [facility], pulse 124 and BP 125/94.  Review of Systems: As mentioned in the history of present illness. All other systems reviewed and are negative. Past Medical History:  Diagnosis Date   Arthritis    CAD (coronary artery disease)    s/p CABG in 2001; lexiscan 2011  no ischemia and nl  LV function   Depression    Dysrhythmia    GERD (gastroesophageal reflux disease)    Headache    headaches for 10 years-been to alot of physicians -cannot find problem   History of kidney stones    HLD (hyperlipidemia)    Hypertension    Myocardial infarction (HCC) 02/1999   Pacemaker    sees Dr. Graciela Husbands   Pneumonia 11/30/2017   Sinus node dysfunction [facility])    s/p PPM   Sleep apnea 04/20/2011   dx at [facility] not tolerate CPAP-does not use   Ventricular tachycardia, non-sustained [facility])    Past Surgical History:  Procedure Laterality Date   BLADDER STONE REMOVAL  2005   CARDIAC CATHETERIZATION  09/2008   patent grafts. 80% proximal stneosis in left circumflex artery (subsequent stress test showed no ischemia)   cataract surgery     bilateral   CORONARY ARTERY BYPASS GRAFT  03/1999   LIMA to  LAD, SVG to RCA, SVG to ramus, sequential SVG to  first and second diagonal.   ECTROPION REPAIR Right 10/30/2021   Procedure: REPAIR OF ECTROPION;  Surgeon: Dairl Ponder, MD;  Location: [facility] OR;  Service: Ophthalmology;  Laterality: Right;   ENTROPIAN REPAIR Right 10/30/2021   Procedure: REPAIR OF ENTROPION;  Surgeon: Dairl Ponder, MD;  Location: [facility] OR;  Service: Ophthalmology;  Laterality: Right;   INSERT / REPLACE / REMOVE PACEMAKER     PACEMAKER INSERTION  09/13/2008   insertion pacemaker   Social History:  reports that he has never smoked. He has never used smokeless tobacco. He reports that he does not drink alcohol and does not use drugs.  No Known Allergies  Family History  Problem Relation Age of Onset   Cancer Mother    Heart disease Father    Heart attack Father    Heart disease Sister    Heart attack Brother    Heart disease Brother    Heart attack Other     Prior to Admission medications   Medication Sig Start Date End Date Taking? Authorizing Provider  aspirin EC 81 MG tablet Take 81 mg by mouth daily. Swallow whole.    [provider]  erythromycin ophthalmic ointment 1 Application. 11/02/21   [provider]  gabapentin (NEURONTIN)  600 MG tablet Take 600 mg by mouth 2 (two) times daily.     [provider]  isosorbide mononitrate (IMDUR) 30 MG 24 hr tablet Take 1 tablet (30 mg total) by mouth daily. Patient not taking: Reported on 12/11/2021 01/29/20 12/11/21  Deboraha Sprang, MD  metoprolol tartrate (LOPRESSOR) 25 MG tablet Take 1 tablet (25 mg total) by mouth 2 (two) times daily. 05/25/11   Deboraha Sprang, MD  Multiple Vitamin (MULTIVITAMIN) tablet Take 1 tablet by mouth daily.      [provider]  nitroGLYCERIN (NITROSTAT) 0.4 MG SL tablet Place 0.4 mg under the tongue every 5 (five) minutes as needed for chest pain.     [provider]  omeprazole (PRILOSEC) 20 MG capsule Take 20 mg by mouth 2 (two) times daily  before a meal.    [provider]  ranolazine (RANEXA) 500 MG 12 hr tablet Take 1 tablet by mouth twice daily 01/12/22   Wellington Hampshire, MD  rosuvastatin (CRESTOR) 5 MG tablet Take 1 tablet (5 mg total) by mouth daily. 12/11/21 12/06/22  Wellington Hampshire, MD  sertraline (ZOLOFT) 50 MG tablet TAKE 2 TABLETS BY MOUTH  DAILY Patient taking differently: Take 50 mg by mouth daily. 09/28/19   Pieter Partridge, DO  tamsulosin (FLOMAX) 0.4 MG CAPS capsule Take 0.4 mg by mouth daily.  08/25/18   [provider]  traZODone (DESYREL) 100 MG tablet Take 200 mg by mouth at bedtime. 11/20/13   [provider]  vitamin B-12 (CYANOCOBALAMIN) 500 MCG tablet Take 500 mcg by mouth daily.    [provider]    Physical Exam: Vitals:   01/22/22 0241 01/22/22 0250 01/22/22 0300 01/22/22 0310  BP:      Pulse:      Resp: 19 (!) 22 (!) 27 (!) 22  Temp:      TempSrc:      SpO2:      Weight:      Height:       Physical Exam Vitals and nursing note reviewed.  Constitutional:      General: He is not in acute distress. HENT:     Head: Normocephalic and atraumatic.  Cardiovascular:     Rate and Rhythm: Normal rate and regular rhythm.     Heart sounds: Normal heart sounds.  Pulmonary:     Effort: Pulmonary effort is normal.     Breath sounds: Normal breath sounds.  Abdominal:     Palpations: Abdomen is soft.     Tenderness: There is no abdominal tenderness.  Neurological:     Mental Status: Mental status is at baseline.     Data Reviewed: Relevant notes from primary care and specialist visits, past discharge summaries as available in EHR, including Care Everywhere. Prior diagnostic testing as pertinent to current admission diagnoses Updated medications and problem lists for reconciliation ED course, including vitals, labs, imaging, treatment and response to treatment Triage notes, nursing and pharmacy notes and ED provider's notes Notable results as noted in  HPI   Assessment and Plan: * ABLA (acute blood loss anemia) Melena Hemoglobin 8 down from 10.62 months prior S/p transfusion of 1 unit PRBC in [facility] ED Serial H&H and transfuse if necessary Keep n.p.o. IV Protonix GI consult  Rapid atrial fibrillation ([place]) Appears new onset IV metoprolol 5 mg x 1 on arrival Will given IV NS bolus and IV hydration and assess for response Continue with metoprolol IV as needed given n.p.o. status  Hold off on systemic anticoagulation due to evaluation for GI bleed Echocardiogram GI consult  CAD s/p CABG Elevated troponin Troponin elevation likely related to demand ischemia from rapid A-fib Patient denies chest pain and EKG nonacute Echocardiogram to evaluate for wall motion abnormality Cardiology consult Continue home metoprolol, isosorbide, Nitrostat, aspirin, ranolazine, rosuvastatin  Pacemaker dual-chamber-[employer]  No acute issues suspected  Stage 3a chronic kidney disease ([place]) Renal function at baseline  Esophageal dysmotility IV Protonix      Advance Care Planning:   Code Status: Full Code   Consults: Cardiology, Dr. Acie Fredrickson and GI Dr. Virgina Jock  Family Communication: step daughter at bedside  Severity of Illness: The appropriate patient status for this patient is INPATIENT. Inpatient status is judged to be reasonable and necessary in order to provide the required intensity of service to ensure the patient's safety. The patient's presenting symptoms, physical exam findings, and initial radiographic and laboratory data in the context of their chronic comorbidities is felt to place them at high risk for further clinical deterioration. Furthermore, it is not anticipated that the patient will be medically stable for discharge from the hospital within 2 midnights of admission.   * I certify that at the point of admission it is my clinical judgment that the patient will require inpatient hospital care spanning beyond 2 midnights from  the point of admission due to high intensity of service, high risk for further deterioration and high frequency of surveillance required.*  Author: Athena Masse, MD 01/22/2022 3:36 AM  For on call review [email or web address].

## 2022-01-22 NOTE — Transfer of Care (Signed)
Immediate Anesthesia Transfer of Care Note  Patient: [name]  Procedure(s) Performed: ESOPHAGOGASTRODUODENOSCOPY (EGD) WITH PROPOFOL  Patient Location: PACU  Anesthesia Type:MAC  Level of Consciousness: sedated  Airway & Oxygen Therapy: Patient Spontanous Breathing and Patient connected to nasal cannula oxygen  Post-op Assessment: Report given to RN and Post -op Vital signs reviewed and stable  Post vital signs: Reviewed and stable  Last Vitals:  Vitals Value Taken Time  BP 137/74 01/22/22 1238  Temp 36.3 C 01/22/22 1238  Pulse 89 01/22/22 1241  Resp 19 01/22/22 1241  SpO2 94 % 01/22/22 1241  Vitals shown include unvalidated device data.  Last Pain:  Vitals:   01/22/22 1238  TempSrc: Temporal  PainSc: 0-No pain         Complications: No notable events documented.

## 2022-01-22 NOTE — Interval H&P Note (Signed)
History and Physical Interval Note: Preprocedure H&P from 01/22/22  was reviewed and there was no interval change after seeing and examining the patient.  Written consent was obtained from the patient after discussion of risks, benefits, and alternatives. Patient has consented to proceed with Esophagogastroduodenoscopy with possible intervention   01/22/2022 12:15 PM  [name]  has presented today for surgery, with the diagnosis of melena, anemia.  The various methods of treatment have been discussed with the patient and family. After consideration of risks, benefits and other options for treatment, the patient has consented to  Procedure(s): ESOPHAGOGASTRODUODENOSCOPY (EGD) WITH PROPOFOL (N/A) as a surgical intervention.  The patient's history has been reviewed, patient examined, no change in status, stable for surgery.  I have reviewed the patient's chart and labs.  Questions were answered to the patient's satisfaction.     [name]

## 2022-01-22 NOTE — Anesthesia Preprocedure Evaluation (Addendum)
Anesthesia Evaluation  Patient identified by MRN, date of birth, ID band Patient awake    Reviewed: Allergy & Precautions, NPO status , Patient's Chart, lab work & pertinent test results  History of Anesthesia Complications Negative for: history of anesthetic complications  Airway Mallampati: II   Neck ROM: Full    Dental no notable dental hx.    Pulmonary sleep apnea    Pulmonary exam normal breath sounds clear to auscultation       Cardiovascular hypertension, + CAD, + Past MI and + CABG  Normal cardiovascular exam+ dysrhythmias (a fib, in RVR currently) + pacemaker  Rhythm:Regular Rate:Normal  ECG 01/22/22:  Atrial fibrillation with rapid ventricular response Marked ST abnormality, possible inferior subendocardial injury Marked ST abnormality, possible anterolateral subendocardial injury  Echo 01/21/22:    1. The left ventricle is normal in size with normal wall thickness.    2. The left ventricular systolic function is moderately decreased, LVEF is  visually estimated at 35-40%.    3. The inferolateral segments and the mid to apical anterior segments appear  hypokinetic.   4. The right ventricle is normal in size, with normal systolic function.    5. The left atrium is severely dilated in size.    6. There is moderate mitral valve regurgitation.    7. There is mild tricuspid regurgitation.    8. There is at least moderate pulmonary hypertension.    9. Rhythm: Atrial Fibrillation.    Neuro/Psych  Headaches   Depression       GI/Hepatic ,GERD  ,,  Endo/Other  negative endocrine ROS    Renal/GU negative Renal ROS     Musculoskeletal  (+) Arthritis ,    Abdominal   Peds  Hematology  (+) Blood dyscrasia, anemia   Anesthesia Other Findings Cardiology note 01/22/22:  Atrial fibrillation with RVR Remote history of paroxysmal atrial fibrillation noted on pacer downloads, nothing consistent, low burden as  outpatient Presenting in atrial fibrillation with RVR Continues to have elevated rate on metoprolol, we will start diltiazem infusion for better rate control, especially in light of pending procedures, EGD -Not a candidate for anticoagulation at this time given bleed    Non-STEMI Known coronary artery disease, history of CABG Echo performed today January 19 showing ejection fraction 50%, unable to exclude regions of wall motion abnormality (decrease in ejection fraction possibly secondary to atrial fibrillation) -For an infusion given active GI bleeding and anemia -Will focus on atrial fibrillation rate control diltiazem infusion as above -Once GI pathology is clarified, could consider left heart cardiac catheterization Not a candidate for catheterization at this time given active GI bleeding   GI bleed Presumed to be upper GI bleed given black tarry stools for the past week or more GI following, Moderate but acceptable risk for low risk procedure/EGD Difficult situation in the setting of atrial fibrillation non-STEMI with known coronary artery disease, hx of CABG   Reproductive/Obstetrics                             Anesthesia Physical Anesthesia Plan  ASA: 4 and emergent  Anesthesia Plan: General   Post-op Pain Management:    Induction: Intravenous  PONV Risk Score and Plan: 2 and Propofol infusion, TIVA and Treatment may vary due to age or medical condition  Airway Management Planned: Natural Airway  Additional Equipment:   Intra-op Plan:   Post-operative Plan:   Informed Consent: I have reviewed the  patients History and Physical, chart, labs and discussed the procedure including the risks, benefits and alternatives for the proposed anesthesia with the patient or authorized representative who has indicated his/her understanding and acceptance.   Patient has DNR.  Discussed DNR with patient and Continue DNR.     Plan Discussed with:  CRNA  Anesthesia Plan Comments: (Difficult situation, discussed at length with Dr. Virgina Jock with cardiology input.  Patient is not ideal candidate for anesthesia given cardiologic state, but will not improve with ongoing GI bleed.  High risk of cardiac and cerebrovascular complications including but not limited to CVA, MI, significant cardiac arrhythmia, death.  Discussed with patient and he wishes to proceed.  Plan to minimize anesthesia.  LMA/GETA backup discussed.  Patient consented for risks of anesthesia including but not limited to:  - adverse reactions to medications - damage to eyes, teeth, lips or other oral mucosa - nerve damage due to positioning  - sore throat or hoarseness - damage to heart, brain, nerves, lungs, other parts of body or loss of life  Informed patient about role of CRNA in peri- and intra-operative care.  Patient voiced understanding.)        Anesthesia Quick Evaluation

## 2022-01-22 NOTE — Op Note (Signed)
[facility] Gastroenterology Patient Name: [name] Procedure Date: 01/22/2022 12:24 PM MRN: [id number] Account #: [id number] Date of Birth: 13-Apr-1927 Admit Type: Inpatient Age: 89 Room: [facility] ENDO ROOM 1 Gender: Male Note Status: Finalized Instrument Name: Upper Endoscope [id number] Procedure:             Upper GI endoscopy Indications:           Melena Providers:             [name] DO, DO Referring MD:          Tawanna Cooler (Referring MD), Jene Every                         MD (Referring MD) Medicines:             Monitored Anesthesia Care Complications:         No immediate complications. Estimated blood loss: None. Procedure:             Pre-Anesthesia Assessment:                        - Prior to the procedure, a History and Physical was                         performed, and patient medications and allergies were                         reviewed. The patient is competent. The risks and                         benefits of the procedure and the sedation options and                         risks were discussed with the patient. All questions                         were answered and informed consent was obtained.                         Patient identification and proposed procedure were                         verified by the physician, the nurse, the anesthetist                         and the technician in the endoscopy suite. Mental                         Status Examination: alert and oriented. Airway                         Examination: normal oropharyngeal airway and neck                         mobility. Respiratory Examination: clear to                         auscultation. CV Examination: irregularly irregular  rate and rhythm and tachycardia noted. Prophylactic                         Antibiotics: The patient does not require prophylactic                         antibiotics. Prior Anticoagulants: The  patient has                         taken no anticoagulant or antiplatelet agents. ASA                         Grade Assessment: IV - A patient with severe systemic                         disease that is a constant threat to life. After                         reviewing the risks and benefits, the patient was                         deemed in satisfactory condition to undergo the                         procedure. The anesthesia plan was to use monitored                         anesthesia care (MAC). Immediately prior to                         administration of medications, the patient was                         re-assessed for adequacy to receive sedatives. The                         heart rate, respiratory rate, oxygen saturations,                         blood pressure, adequacy of pulmonary ventilation, and                         response to care were monitored throughout the                         procedure. The physical status of the patient was                         re-assessed after the procedure.                        After obtaining informed consent, the endoscope was                         passed under direct vision. Throughout the procedure,                         the patient's blood pressure, pulse, and oxygen  saturations were monitored continuously. The Endoscope                         was introduced through the mouth, and advanced to the                         second part of duodenum. The upper GI endoscopy was                         accomplished without difficulty. The patient tolerated                         the procedure. Findings:      The duodenal bulb, first portion of the duodenum and second portion of       the duodenum were normal. Estimated blood loss: none.      A small hiatal hernia was present.      The entire examined stomach was normal. Estimated blood loss: none.      No fresh or old blood noted within the gastric or  duodenal lumen.       Estimated blood loss: none.      The Z-line was regular. Estimated blood loss: none.      Esophagogastric landmarks were identified: the gastroesophageal junction       was found at 36 cm from the incisors. Impression:            - Normal duodenal bulb, first portion of the duodenum                         and second portion of the duodenum.                        - Small hiatal hernia.                        - Normal stomach.                        - Z-line regular.                        - Esophagogastric landmarks identified.                        - No specimens collected. Recommendation:        - Return patient to hospital ward for ongoing care.                        - Full liquid diet.                        - Advance diet as tolerated.                        - Continue present medications.                        - The findings and recommendations were discussed with                         the patient.                        -  The findings and recommendations were discussed with                         the patient's family.                        - The findings and recommendations were discussed with                         the referring physician.                        - Due to patient intolerance and poor reserve, he will                         not be a colonoscopy candidate.                        Should concern for further bleeding occur, recommend                         tagged gi bleed/rbc scan, however, again- pt will not                         be a colonoscopy candidate, so this scan may not be                         needed.                        Can transition protonix to 40 mg once daily by mouth Procedure Code(s):     --- Professional ---                        [phone], Esophagogastroduodenoscopy, flexible,                         transoral; diagnostic, including collection of                         specimen(s) by brushing or washing, when  performed                         (separate procedure) Diagnosis Code(s):     --- Professional ---                        K44.9, Diaphragmatic hernia without obstruction or                         gangrene                        K92.1, Melena (includes Hematochezia) CPT copyright 2022 American Medical Association. All rights reserved. The codes documented in this report are preliminary and upon coder review may  be revised to meet current compliance requirements. Attending Participation:      I personally performed the entire procedure. Volney American, DO [name] DO, DO 01/22/2022 12:41:16 PM This report has been signed electronically. Number of Addenda: 0 Note Initiated On: 01/22/2022 12:24 PM Estimated Blood Loss:  Estimated blood loss: none.  [facility]
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
## 2022-01-22 NOTE — Assessment & Plan Note (Addendum)
No acute issues suspected 

## 2022-01-22 NOTE — Assessment & Plan Note (Addendum)
Elevated troponin Troponin elevation likely related to demand ischemia from rapid A-fib Patient denies chest pain and EKG nonacute Echocardiogram to evaluate for wall motion abnormality Cardiology consult Continue home metoprolol, isosorbide, Nitrostat, aspirin, ranolazine, rosuvastatin

## 2022-01-22 NOTE — Care Plan (Addendum)
Post op note Should concern for further bleeding occur, recommend tagged gi bleed/rbc scan. However, pt did not tolerate low amounts of sedation. Has poor reserve. Will not be a colonoscopy candidate, so this scan may not be needed. Would potentially be candidate for vascular surgery intervention/embolization if active bleed on gi bleed scan Can transition protonix to 40 mg once daily by mouth Spoke with patient's daughter, Lattie Haw, over the phone and updated on results.  GI to sign off. Available as needed. Please do not hesitate to call regarding questions or concerns. Dr. Vicente Males will be covering the GI service over the weekend should call back be needed  Ronne Binning, [facility]

## 2022-01-22 NOTE — Assessment & Plan Note (Addendum)
Due to melena Hemoglobin 8 down from 10.62 months prior S/p transfusion of 1 unit PRBC in [facility] ED Conservative management for now per patient request no further drop in his hemoglobin.  No need for transfusion at this time
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
## 2022-01-22 NOTE — Assessment & Plan Note (Addendum)
Renal function at baseline 

## 2022-01-22 NOTE — IPAL (Signed)
  Interdisciplinary Goals of Care Family Meeting   Date carried out: 01/22/2022  Location of the meeting: Bedside  Member's involved: Physician and Family Member or next of kin, step daughter  Durable Power of Attorney or Loss adjuster, chartered: patient    Discussion: We discussed goals of care for [employer] .   I have reviewed medical records including EPIC notes, labs and imaging, assessed the patient and then met with patient and step daughter to discuss major active diagnoses, plan of care, natural trajectory, prognosis, GOC, EOL wishes, disposition and options including Full code/DNI/DNR and the concept of comfort care if DNR is elected. Questions and concerns were addressed. They are  in agreement to continue current plan of care . Election for DNR status.   Code status:   Code Status: DNR   Disposition: Continue current acute care  Time spent for the meeting: [place], MD  01/22/2022, 5:25 AM

## 2022-01-22 NOTE — Assessment & Plan Note (Addendum)
Continue Protonix °

## 2022-01-22 NOTE — Final Progress Note (Addendum)
Same day rounding progress note  Patient seen and examined.  Agree with assessment and plan outlined in Dr. Josefine Class dictated history and physical.  Acute blood loss anemia Seen by GI status post EGD showing no signs of active bleeding Not a candidate for colonoscopy Monitor H&H PPI Transfuse to keep hemoglobin above 8 considering underlying cardiac history  A-fib with RVR Seen by cardiology Not a candidate for anticoagulation given concern for bleed Rate control with cardizem and or metoprolol  NSTEMI Troponin trending up (7915->0569), echo showing EF of 50% Not a candidate for cath considering concern for GI bleed  Discussed with patient and daughter at bedside  Overall poor prognosis.  Will consult palliative care for goals of care  Time spent: 35 minutes

## 2022-01-22 NOTE — Progress Notes (Addendum)
CROSS COVER NOTE  NAME: MARIOS GAISER MRN: [id number] DOB : June 21, 1927   HPI/Events of Note   Critical troponin of 4892 reported   Assessment and  Interventions   Assessment: Patient with GI bleed and 2 gm drop in HGB Plan: Hold heparin therapy Serial H & H - consider transfusion for hemodynamic instability (has had 2 gm drop) or hgb less than 7       Kathlene Cote NP [facility]

## 2022-01-22 NOTE — Hospital Course (Signed)
CAD s/p CABG 2001, CKD 3A, history of pacemaker for sinus node dysfunction, chronic headache, depression, HTN, OSA intolerant of CPAP, esophageal dysmotility, who is admitted from [facility]

## 2022-01-22 NOTE — Assessment & Plan Note (Addendum)
Converted to normal sinus rhythm yesterday Not a candidate for anticoagulation secondary to GI bleed and anemia Continue metoprolol for rate control

## 2022-01-22 NOTE — TOC Initial Note (Signed)
Transition of Care [facility]) - Initial/Assessment Note    Patient Details  Name: [name] MRN: [id number] Date of Birth: September 17, 1927  Transition of Care [facility]) CM/SW Contact:    Laurena Slimmer, RN Phone Number: 01/22/2022, 11:30 AM  Clinical Narrative:                  Transition of Care [facility]) Screening Note   Patient Details  Name: [name] Date of Birth: 11-Dec-1927   Transition of Care [facility]) CM/SW Contact:    Laurena Slimmer, RN Phone Number: 01/22/2022, 11:30 AM    Transition of Care Department [facility]) has reviewed patient and no TOC needs have been identified at this time. We will continue to monitor patient advancement through interdisciplinary progression rounds. If new patient transition needs arise, please place a TOC consult.          Patient Goals and CMS Choice            Expected Discharge Plan and Services                                              Prior Living Arrangements/Services                       Activities of Daily Living Home Assistive Devices/Equipment: None ADL Screening (condition at time of admission) Patient's cognitive ability adequate to safely complete daily activities?: Yes Is the patient deaf or have difficulty hearing?: Yes Does the patient have difficulty seeing, even when wearing glasses/contacts?: No Does the patient have difficulty concentrating, remembering, or making decisions?: No Patient able to express need for assistance with ADLs?: No Does the patient have difficulty dressing or bathing?: No Independently performs ADLs?: Yes (appropriate for developmental age) Does the patient have difficulty walking or climbing stairs?: No Weakness of Legs: None Weakness of Arms/Hands: None  Permission Sought/Granted                  Emotional Assessment              Admission diagnosis:  Elevated troponin [R79.89] Patient Active Problem List   Diagnosis Date Noted   Elevated troponin  01/22/2022   Rapid atrial fibrillation ([place]) 01/22/2022   Pressure injury of skin 01/22/2022   ABLA (acute blood loss anemia) 01/22/2022   Melena 01/22/2022   CAD s/p CABG 01/22/2022   Stage 3a chronic kidney disease ([place]) 01/22/2022   Non-ST elevation (NSTEMI) myocardial infarction ([place]) 01/22/2022   Chronic right-sided low back pain with right-sided sciatica 11/12/2021   Esophageal dysmotility 11/12/2021   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 [place] [address] Phone: [phone] Fax: [phone]  [facility] - [place], [place] [facility] [address] [place] [address] [place] [address] Phone: [phone] Fax: [place], [address] [place] [address] Phone: [phone] Fax: [phone]     Social Determinants of Health (SDOH) Social History: SDOH Screenings   Food Insecurity: No  Food Insecurity (01/22/2022)  Housing: Low Risk  (01/22/2022)  Transportation Needs: No Transportation Needs (01/22/2022)  Utilities: Not At Risk (01/22/2022)  Tobacco Use: Low Risk  (01/22/2022)   SDOH Interventions: Housing Interventions: Intervention Not Indicated   Readmission Risk Interventions     No data to display

## 2022-01-23 DIAGNOSIS — Z7189 Other specified counseling: Secondary | ICD-10-CM | POA: Diagnosis not present

## 2022-01-23 DIAGNOSIS — N1831 Chronic kidney disease, stage 3a: Secondary | ICD-10-CM

## 2022-01-23 DIAGNOSIS — Z515 Encounter for palliative care: Secondary | ICD-10-CM

## 2022-01-23 DIAGNOSIS — I214 Non-ST elevation (NSTEMI) myocardial infarction: Secondary | ICD-10-CM | POA: Diagnosis not present

## 2022-01-23 DIAGNOSIS — D62 Acute posthemorrhagic anemia: Secondary | ICD-10-CM | POA: Diagnosis not present

## 2022-01-23 DIAGNOSIS — K921 Melena: Secondary | ICD-10-CM | POA: Diagnosis not present

## 2022-01-23 DIAGNOSIS — I4891 Unspecified atrial fibrillation: Secondary | ICD-10-CM | POA: Diagnosis not present

## 2022-01-23 LAB — CBC
HCT: 28.6 % — ABNORMAL LOW (ref 39.0–52.0)
Hemoglobin: 8.7 g/dL — ABNORMAL LOW (ref 13.0–17.0)
MCH: 28.6 pg (ref 26.0–34.0)
MCHC: 30.4 g/dL (ref 30.0–36.0)
MCV: 94.1 fL (ref 80.0–100.0)
Platelets: 201 10*3/uL (ref 150–400)
RBC: 3.04 MIL/uL — ABNORMAL LOW (ref 4.22–5.81)
RDW: 19.7 % — ABNORMAL HIGH (ref 11.5–15.5)
WBC: 12.1 10*3/uL — ABNORMAL HIGH (ref 4.0–10.5)
nRBC: 0.2 % (ref 0.0–0.2)

## 2022-01-23 LAB — BASIC METABOLIC PANEL
Anion gap: 8 (ref 5–15)
BUN: 36 mg/dL — ABNORMAL HIGH (ref 8–23)
CO2: 23 mmol/L (ref 22–32)
Calcium: 8.1 mg/dL — ABNORMAL LOW (ref 8.9–10.3)
Chloride: 114 mmol/L — ABNORMAL HIGH (ref 98–111)
Creatinine, Ser: 1.11 mg/dL (ref 0.61–1.24)
GFR, Estimated: >60 mL/min (ref >60)
Glucose, Bld: 137 mg/dL — ABNORMAL HIGH (ref 70–99)
Potassium: 4.8 mmol/L (ref 3.5–5.1)
Sodium: 145 mmol/L (ref 135–145)

## 2022-01-23 LAB — TROPONIN I (HIGH SENSITIVITY): Troponin I (High Sensitivity): 2914 ng/L (ref <18)

## 2022-01-23 MED ORDER — MORPHINE SULFATE (CONCENTRATE) 10 MG /0.5 ML PO SOLN: 5.0000 mg | ORAL | 0 refills | Status: AC | PRN

## 2022-01-23 MED ORDER — HYOSCYAMINE SULFATE 0.125 MG/5ML PO ELIX: 0.1250 mg | ORAL_SOLUTION | ORAL | Status: AC | PRN

## 2022-01-23 MED ORDER — METOPROLOL TARTRATE 50 MG PO TABS: 50.0000 mg | ORAL_TABLET | Freq: Two times a day (BID) | ORAL | Status: DC

## 2022-01-23 MED ORDER — SODIUM CHLORIDE 0.9 % IV SOLN: 25.0000 mg | Freq: Once | INTRAVENOUS | Status: DC

## 2022-01-23 MED ORDER — SODIUM CHLORIDE 0.9 % IV SOLN
25.0000 mg | Freq: Once | INTRAVENOUS | Status: AC
  Administered 2022-01-23: 25 mg via INTRAVENOUS
  Filled 2022-01-23: qty 1

## 2022-01-23 MED ORDER — LORAZEPAM 0.5 MG PO TABS: 0.5000 mg | ORAL_TABLET | ORAL | 0 refills | Status: AC | PRN

## 2022-01-23 NOTE — Progress Notes (Signed)
  Progress Note   Patient: [name] POE:[id number] DOB: February 25, 1927 DOA: 01/22/2022     1 DOS: the patient was seen and examined on 01/23/2022   Brief hospital course: 89 y.o. male with history of CAD s/p CABG in 2001, sinus node dysfunction s/p PPM, HTN, and HLD who was transferred from [facility] for A-fib with RVR, melena with new anemia.  1/19: EGD showed small hiatal hernia, no active bleeding.  Not a candidate for colonoscopy per GI.  NSTEMI-conservative management 1/20: Patient/family requesting comfort care/hospice eval  Assessment and Plan: * ABLA (acute blood loss anemia) Due to melena Hemoglobin 8 down from 10.62 months prior S/p transfusion of 1 unit PRBC in [facility] ED Conservative management for now per patient request no further drop in his hemoglobin.  No need for transfusion at this time  Melena Resolved now.  H&H stable.  EGD within normal limit.  Not a candidate for colonoscopy due to poor result.  If bleeds further may need nuclear/bleeding scan which patient is not interested  Rapid atrial fibrillation ([place]) Converted to normal sinus rhythm yesterday Not a candidate for anticoagulation secondary to GI bleed and anemia Continue metoprolol for rate control   Pacemaker dual-chamber-Medtronic  No acute issues suspected.  Goals of care, counseling/discussion Comfort care/hospice requested  Non-ST elevation (NSTEMI) myocardial infarction [facility]) Known history of CAD s/p CABG Echo showed EF of 50% Conservative management for now considering concern for GI bleed.  Patient not interested in aggressive management.  He is leaning towards comfort care/hospice   Stage 3a chronic kidney disease ([place]) Renal function at baseline.  Esophageal dysmotility Continue Protonix        Subjective: Patient requesting to keep him comfortable and evaluate for hospice.  He feels death is near and requesting no aggressive management  Physical Exam: Vitals:   01/23/22 0133  01/23/22 0135 01/23/22 0800 01/23/22 1200  BP: 133/80 133/80 (!) 141/80 139/76  Pulse: 75  77 84  Resp: 19  17 19   Temp:   97.9 F (36.6 C) 97.9 F (36.6 C)  TempSrc:   Oral   SpO2: 97%  97% 93%  Weight:      Height:       89 year old male lying in the bed comfortably without any acute distress Lungs clear to auscultation bilaterally Cardiovascular regular rate and rhythm Abdomen soft, benign Neuro alert and awake, nonfocal Skin no rash or lesion Data Reviewed:  Hemoglobin 8.7, troponin peaked at 5022  Family Communication: [place] at bedside and was updated  Disposition: Status is: Inpatient Remains inpatient appropriate because: Evaluation of hospice  Planned Discharge Destination:  Hospice home   DVT prophylaxis-SCDs Time spent: 35 minutes  Author: Max Sane, MD 01/23/2022 2:31 PM  For on call review [email or web address].

## 2022-01-23 NOTE — IPAL (Signed)
  Interdisciplinary Goals of Care Family Meeting   Date carried out: 01/23/2022  Location of the meeting: Bedside  Member's involved: Physician and Family Member or next of kin  Durable Power of Attorney or acting medical decision maker: Patient, stepson  Discussion: We discussed goals of care for [employer]   Code status:   Code Status: DNR   Disposition: In-patient comfort care/hospice home  Time spent for the meeting: 35 minutes    Max Sane, MD  01/23/2022, 1:16 PM

## 2022-01-23 NOTE — Anesthesia Postprocedure Evaluation (Signed)
Anesthesia Post Note  Patient: [name]  Procedure(s) Performed: ESOPHAGOGASTRODUODENOSCOPY (EGD) WITH PROPOFOL  Patient location during evaluation: PACU Anesthesia Type: General Level of consciousness: awake and alert, oriented and patient cooperative Pain management: pain level controlled Vital Signs Assessment: post-procedure vital signs reviewed and stable Respiratory status: spontaneous breathing, nonlabored ventilation and respiratory function stable Cardiovascular status: blood pressure returned to baseline and stable Postop Assessment: adequate PO intake Anesthetic complications: no   No notable events documented.   Last Vitals:  Vitals:   01/23/22 0133 01/23/22 0135  BP: 133/80 133/80  Pulse: 75   Resp: 19   Temp:    SpO2: 97%     Last Pain:  Vitals:   01/23/22 0322  TempSrc:   PainSc: Asleep                 Darrin Nipper

## 2022-01-23 NOTE — Assessment & Plan Note (Signed)
Resolved now.  H&H stable.  EGD within normal limit.  Not a candidate for colonoscopy due to poor result.  If bleeds further may need nuclear/bleeding scan which patient is not interested

## 2022-01-23 NOTE — TOC Transition Note (Signed)
Transition of Care [facility]) - CM/SW Discharge Note   Patient Details  Name: [name] MRN: [id number] Date of Birth: December 09, 1927  Transition of Care [facility]) CM/SW Contact:  Valente David, RN Phone Number: 01/23/2022, 4:12 PM   Clinical Narrative:     Patient will discharge to [facility] later this evening.  EMS paperwork completed and printed for chart.  Hospice nurse will call for transportation later as facility unable to accept patient until around 8pm.   Final next level of care: [place] Barriers to Discharge: Barriers Resolved   Patient Goals and CMS Choice      Discharge Placement                  Patient to be transferred to facility by: [facility]-emergent [facility] Name of family member notified: Holley Dexter Patient and family notified of of transfer: 01/23/22  Discharge Plan and Services Additional resources added to the After Visit Summary for                                       Social Determinants of Health (SDOH) Interventions SDOH Screenings   Food Insecurity: No Food Insecurity (01/22/2022)  Housing: Low Risk  (01/22/2022)  Transportation Needs: No Transportation Needs (01/22/2022)  Utilities: Not At Risk (01/22/2022)  Tobacco Use: Low Risk  (01/22/2022)     Readmission Risk Interventions     No data to display

## 2022-01-23 NOTE — Progress Notes (Signed)
[facility]) Hospice hospital liaison note   Referral received for Hospice Home.    Met with patient and step son in room and talked with step daughter by phone. Hospice home is able to accept patient after 7:30pm once consents are complete.    RN staff, you may call report at any time to [phone], room is assigned when report is called.  Please leave IV intact.    Please send completed DNR with patient.   Updated attending and [facility] manager via [place]. Thank you for the opportunity to participate in this patient's care Jhonnie Garner, BSN, RN Hospice nurse liaison [phone]

## 2022-01-23 NOTE — Progress Notes (Signed)
EMS arrived to transport patient to [facility]. Family at bedside and all belongings sent with them. Pain med and nausea med given around 2000 prior to transport. Ivs left intact.

## 2022-01-23 NOTE — Assessment & Plan Note (Signed)
Comfort care/hospice requested
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
## 2022-01-23 NOTE — Hospital Course (Signed)
89 y.o. male with history of CAD s/p CABG in 2001, sinus node dysfunction s/p PPM, HTN, and HLD who was transferred from [facility] for A-fib with RVR, melena with new anemia.  1/19: EGD showed small hiatal hernia, no active bleeding.  Not a candidate for colonoscopy per GI.  NSTEMI-conservative management 1/20: Patient/family requesting comfort care/hospice eval

## 2022-01-23 NOTE — Discharge Summary (Signed)
Physician Discharge Summary   Patient: [name] MRN: [id number] DOB: 1927-10-12  Admit date:     01/22/2022  Discharge date: 01/23/22  Discharge Physician: Max Sane   PCP: Jene Every, MD   Recommendations at discharge:    Hospice Home  Discharge Diagnoses: Principal Problem:   ABLA (acute blood loss anemia) Active Problems:   Melena   Rapid atrial fibrillation (HCC)   Pacemaker dual-chamber-Medtronic    Esophageal dysmotility   Stage 3a chronic kidney disease (HCC)   Non-ST elevation (NSTEMI) myocardial infarction ([place])   Goals of care, counseling/discussion  Resolved Problems:   * No resolved hospital problems. *  Hospital Course: 89 y.o. male with history of CAD s/p CABG in 2001, sinus node dysfunction s/p PPM, HTN, and HLD who was transferred from [facility] for A-fib with RVR, melena with new anemia.  1/19: EGD showed small hiatal hernia, no active bleeding.  Not a candidate for colonoscopy per GI.  NSTEMI-conservative management 1/20: Patient/family requesting comfort care/hospice eval  Assessment and Plan: ABLA (acute blood loss anemia) Due to melena Melena Rapid atrial fibrillation (HCC) Pacemaker dual-chamber-Medtronic  Non-ST elevation (NSTEMI) myocardial infarction (HCC) Stage 3a chronic kidney disease ([place]) Esophageal dysmotility  Patient/family chose Hospice Home and is being discharged there today as bed is available.       Consultants: GI,Cardio Procedures performed: EGD  Disposition: Hospice care Diet recommendation:  Discharge Diet Orders (From admission, onward)     Start     Ordered   01/23/22 0000  Diet - low sodium heart healthy        01/23/22 1610           Regular diet DISCHARGE MEDICATION: Allergies as of 01/23/2022   No Known Allergies      Medication List     STOP taking these medications    aspirin EC 81 MG tablet   erythromycin ophthalmic ointment   gabapentin 600 MG tablet Commonly known as:  NEURONTIN   isosorbide mononitrate 30 MG 24 hr tablet Commonly known as: IMDUR   metoprolol tartrate 25 MG tablet Commonly known as: LOPRESSOR   multivitamin tablet   nitroGLYCERIN 0.4 MG SL tablet Commonly known as: NITROSTAT   omeprazole 20 MG capsule Commonly known as: PRILOSEC   ranolazine 500 MG 12 hr tablet Commonly known as: RANEXA   rosuvastatin 5 MG tablet Commonly known as: CRESTOR   sertraline 50 MG tablet Commonly known as: ZOLOFT   tamsulosin 0.4 MG Caps capsule Commonly known as: FLOMAX   traZODone 100 MG tablet Commonly known as: DESYREL   vitamin B-12 500 MCG tablet Commonly known as: CYANOCOBALAMIN       TAKE these medications    hyoscyamine 0.125 MG/5ML Elix Commonly known as: LEVSIN Take 5 mLs (0.125 mg total) by mouth every 4 (four) hours as needed.   LORazepam 0.5 MG tablet Commonly known as: Ativan Take 1 tablet (0.5 mg total) by mouth every 4 (four) hours as needed for anxiety.   morphine CONCENTRATE 10 mg / 0.5 ml concentrated solution Take 0.25 mLs (5 mg total) by mouth every 2 (two) hours as needed for severe pain.        Discharge Exam: Filed Weights   01/22/22 0220 01/22/22 1156  Weight: 60.8 kg 29.81 kg   89 year old male lying in the bed comfortably without any acute distress Lungs clear to auscultation bilaterally Cardiovascular regular rate and rhythm Abdomen soft, benign Neuro alert and awake, nonfocal Skin no rash or lesion  Condition at discharge: poor  The results of significant diagnostics from this hospitalization (including imaging, microbiology, ancillary and laboratory) are listed below for reference.   Imaging Studies: ECHOCARDIOGRAM COMPLETE  Result Date: 01/22/2022    ECHOCARDIOGRAM REPORT   Patient Name:   [name] Date of Exam: 01/22/2022 Medical Rec #:  [id number]     Height:       67.0 in Accession #:    [id number]    Weight:       134.0 lb Date of Birth:  [birth year]     BSA:          1.706 m  Patient Age:    55 years      BP:           119/78 mmHg Patient Gender: M             HR:           105 bpm. Exam Location:  [facility] Procedure: 2D Echo, Cardiac Doppler and Color Doppler Indications:     Atrial Fibrillation I48.91  History:         Patient has no prior history of Echocardiogram examinations.                  CAD and Previous Myocardial Infarction, Pacemaker; Risk                  Factors:Hypertension.  Sonographer:     Sherrie Sport Referring Phys:  [id number] Athena Masse Diagnosing Phys: Ida Rogue MD  Sonographer Comments: No parasternal window. IMPRESSIONS  1. Left ventricular ejection fraction, by estimation, is 50 to 55%. The left ventricle has low normal function. The left ventricle has no regional wall motion abnormalities. Left ventricular diastolic parameters are indeterminate.  2. Right ventricular systolic function is normal. The right ventricular size is normal. There is normal pulmonary artery systolic pressure. The estimated right ventricular systolic pressure is 07.3 mmHg.  3. Left atrial size was moderately dilated.  4. The mitral valve is normal in structure. Moderate mitral valve regurgitation. No evidence of mitral stenosis.  5. The aortic valve is normal in structure. Aortic valve regurgitation is not visualized. No aortic stenosis is present.  6. The inferior vena cava is normal in size with greater than 50% respiratory variability, suggesting right atrial pressure of 3 mmHg. FINDINGS  Left Ventricle: Left ventricular ejection fraction, by estimation, is 50 to 55%. The left ventricle has low normal function. The left ventricle has no regional wall motion abnormalities. The left ventricular internal cavity size was normal in size. There is no left ventricular hypertrophy. Left ventricular diastolic parameters are indeterminate. Right Ventricle: The right ventricular size is normal. No increase in right ventricular wall thickness. Right ventricular systolic function is normal. There  is normal pulmonary artery systolic pressure. The tricuspid regurgitant velocity is 2.65 m/s, and  with an assumed right atrial pressure of 5 mmHg, the estimated right ventricular systolic pressure is 71.0 mmHg. Left Atrium: Left atrial size was moderately dilated. Right Atrium: Right atrial size was normal in size. Pericardium: There is no evidence of pericardial effusion. Mitral Valve: The mitral valve is normal in structure. There is mild thickening of the mitral valve leaflet(s). Moderate mitral valve regurgitation. No evidence of mitral valve stenosis. Tricuspid Valve: The tricuspid valve is normal in structure. Tricuspid valve regurgitation is mild . No evidence of tricuspid stenosis. Aortic Valve: The aortic valve is normal in structure. Aortic valve regurgitation is not visualized. No  aortic stenosis is present. Aortic valve mean gradient measures 3.0 mmHg. Aortic valve peak gradient measures 5.2 mmHg. Aortic valve area, by VTI measures 3.21 cm. Pulmonic Valve: The pulmonic valve was normal in structure. Pulmonic valve regurgitation is not visualized. No evidence of pulmonic stenosis. Aorta: The aortic root is normal in size and structure. Venous: The inferior vena cava is normal in size with greater than 50% respiratory variability, suggesting right atrial pressure of 3 mmHg. IAS/Shunts: No atrial level shunt detected by color flow Doppler.  LEFT VENTRICLE PLAX 2D LVIDd:         3.90 cm LVIDs:         2.80 cm LV PW:         0.90 cm LV IVS:        1.60 cm LVOT diam:     2.00 cm LV SV:         47 LV SV Index:   28 LVOT Area:     3.14 cm  RIGHT VENTRICLE RV Basal diam:  3.00 cm RV Mid diam:    2.50 cm RV S prime:     11.20 cm/s TAPSE (M-mode): 1.7 cm LEFT ATRIUM             Index        RIGHT ATRIUM           Index LA diam:        4.60 cm 2.70 cm/m   RA Area:     11.10 cm LA Vol (A2C):   90.1 ml 52.81 ml/m  RA Volume:   20.80 ml  12.19 ml/m LA Vol (A4C):   68.1 ml 39.92 ml/m LA Biplane Vol: 79.4 ml  46.54 ml/m  AORTIC VALVE AV Area (Vmax):    2.78 cm AV Area (Vmean):   2.73 cm AV Area (VTI):     3.21 cm AV Vmax:           114.00 cm/s AV Vmean:          78.400 cm/s AV VTI:            0.148 m AV Peak Grad:      5.2 mmHg AV Mean Grad:      3.0 mmHg LVOT Vmax:         101.00 cm/s LVOT Vmean:        68.100 cm/s LVOT VTI:          0.151 m LVOT/AV VTI ratio: 1.02 MITRAL VALVE                TRICUSPID VALVE MV Area (PHT): 8.34 cm     TR Peak grad:   28.1 mmHg MV Decel Time: 91 msec      TR Vmax:        265.00 cm/s MV E velocity: 131.00 cm/s                             SHUNTS                             Systemic VTI:  0.15 m                             Systemic Diam: 2.00 cm Julien Nordmann MD Electronically signed by Julien Nordmann MD Signature Date/Time: 01/22/2022/5:03:24 PM    Final  Microbiology: Results for orders placed or performed during the hospital encounter of 03/13/19  SARS CORONAVIRUS 2 (TAT 6-24 HRS) Nasopharyngeal Nasopharyngeal Swab     Status: None   Collection Time: 03/13/19 11:07 AM   Specimen: Nasopharyngeal Swab  Result Value Ref Range Status   SARS Coronavirus 2 NEGATIVE NEGATIVE Final    Comment: (NOTE) SARS-CoV-2 target nucleic acids are NOT DETECTED. The SARS-CoV-2 RNA is generally detectable in upper and lower respiratory specimens during the acute phase of infection. Negative results do not preclude SARS-CoV-2 infection, do not rule out co-infections with other pathogens, and should not be used as the sole basis for treatment or other patient management decisions. Negative results must be combined with clinical observations, patient history, and epidemiological information. The expected result is Negative. Fact Sheet for Patients: HairSlick.no Fact Sheet for Healthcare Providers: quierodirigir.com This test is not yet approved or cleared by the [place] FDA and  has been authorized for detection and/or  diagnosis of SARS-CoV-2 by FDA under an Emergency Use Authorization (EUA). This EUA will remain  in effect (meaning this test can be used) for the duration of the COVID-19 declaration under Section 56 4(b)(1) of the Act, 21 U.S.C. section 360bbb-3(b)(1), unless the authorization is terminated or revoked sooner. Performed at [facility], [address] [place], [place] [address]     Labs: CBC: Recent Labs  Lab 01/22/22 0413 01/22/22 0628 01/23/22 0517  WBC 10.4  --  12.1*  NEUTROABS 7.6  --   --   HGB 8.1* 8.4* 8.7*  HCT 25.4*  --  28.6*  MCV 90.1  --  94.1  PLT 204  --  201   Basic Metabolic Panel: Recent Labs  Lab 01/22/22 0413 01/23/22 0517  NA 142 145  K 3.3* 4.8  CL 109 114*  CO2 26 23  GLUCOSE 121* 137*  BUN 24* 36*  CREATININE 1.06 1.11  CALCIUM 7.9* 8.1*  MG 1.8  --   PHOS 4.3  --    Liver Function Tests: Recent Labs  Lab 01/22/22 0413  AST 50*  ALT 63*  ALKPHOS 60  BILITOT 0.9  PROT 5.1*  ALBUMIN 2.6*   CBG: No results for input(s): "GLUCAP" in the last 168 hours.  Discharge time spent: greater than 30 minutes.  Signed: Delfino Lovett, MD [facility] 01/23/2022

## 2022-01-23 NOTE — Assessment & Plan Note (Signed)
Known history of CAD s/p CABG Echo showed EF of 50% Conservative management for now considering concern for GI bleed.  Patient not interested in aggressive management.  He is leaning towards comfort care/hospice

## 2022-01-25 ENCOUNTER — Encounter: Payer: Self-pay | Admitting: Gastroenterology

## 2022-01-28 ENCOUNTER — Other Ambulatory Visit: Payer: Self-pay | Admitting: Cardiovascular Disease

## 2022-01-28 DIAGNOSIS — I739 Peripheral vascular disease, unspecified: Secondary | ICD-10-CM

## 2022-02-04 DEATH — deceased

## 2022-06-18 ENCOUNTER — Ambulatory Visit: Payer: Medicare Other | Admitting: Cardiovascular Disease
